# Patient Record
Sex: Female | Born: 1985 | Race: Asian | Hispanic: No | Marital: Married | Smoking: Never smoker
Health system: Southern US, Community
[De-identification: ages and names within clinical notes are randomized; demographics above are authoritative.]

## PROBLEM LIST (undated history)

## (undated) DIAGNOSIS — D649 Anemia, unspecified: Secondary | ICD-10-CM

## (undated) DIAGNOSIS — E079 Disorder of thyroid, unspecified: Secondary | ICD-10-CM

## (undated) HISTORY — DX: Disorder of thyroid, unspecified: E07.9

## (undated) HISTORY — DX: Anemia, unspecified: D64.9

---

## 2016-06-28 ENCOUNTER — Encounter: Payer: Self-pay | Admitting: Family Medicine

## 2016-06-28 ENCOUNTER — Ambulatory Visit (INDEPENDENT_AMBULATORY_CARE_PROVIDER_SITE_OTHER): Payer: 59 | Admitting: Family Medicine

## 2016-06-28 VITALS — BP 120/70 | HR 88 | Temp 98.4°F | Resp 12 | Ht 62.0 in | Wt 245.0 lb

## 2016-06-28 DIAGNOSIS — Z6841 Body Mass Index (BMI) 40.0 and over, adult: Secondary | ICD-10-CM

## 2016-06-28 DIAGNOSIS — D649 Anemia, unspecified: Secondary | ICD-10-CM | POA: Diagnosis not present

## 2016-06-28 DIAGNOSIS — E039 Hypothyroidism, unspecified: Secondary | ICD-10-CM | POA: Diagnosis not present

## 2016-06-28 DIAGNOSIS — R5382 Chronic fatigue, unspecified: Secondary | ICD-10-CM

## 2016-06-28 LAB — BASIC METABOLIC PANEL
BUN: 5 mg/dL — AB (ref 6–23)
CALCIUM: 8.8 mg/dL (ref 8.4–10.5)
CO2: 26 meq/L (ref 19–32)
Chloride: 109 mEq/L (ref 96–112)
Creatinine, Ser: 0.58 mg/dL (ref 0.40–1.20)
GFR: 128.06 mL/min (ref >60.00)
GLUCOSE: 85 mg/dL (ref 70–99)
POTASSIUM: 4.4 meq/L (ref 3.5–5.1)
Sodium: 141 mEq/L (ref 135–145)

## 2016-06-28 LAB — CBC WITH DIFFERENTIAL/PLATELET
Basophils Absolute: 0 10*3/uL (ref 0.0–0.1)
Basophils Relative: 0.4 % (ref 0.0–3.0)
EOS PCT: 2.4 % (ref 0.0–5.0)
Eosinophils Absolute: 0.2 10*3/uL (ref 0.0–0.7)
HEMATOCRIT: 29 % — AB (ref 36.0–46.0)
Hemoglobin: 8.8 g/dL — ABNORMAL LOW (ref 12.0–15.0)
LYMPHS ABS: 2 10*3/uL (ref 0.7–4.0)
Lymphocytes Relative: 32.3 % (ref 12.0–46.0)
MCHC: 30.5 g/dL (ref 30.0–36.0)
MCV: 62.5 fl — AB (ref 78.0–100.0)
MONOS PCT: 5.8 % (ref 3.0–12.0)
Monocytes Absolute: 0.4 10*3/uL (ref 0.1–1.0)
NEUTROS ABS: 3.7 10*3/uL (ref 1.4–7.7)
NEUTROS PCT: 59.1 % (ref 43.0–77.0)
PLATELETS: 313 10*3/uL (ref 150.0–400.0)
RBC: 4.64 Mil/uL (ref 3.87–5.11)
RDW: 21 % — ABNORMAL HIGH (ref 11.5–15.5)
WBC: 6.3 10*3/uL (ref 4.0–10.5)

## 2016-06-28 LAB — VITAMIN B12: Vitamin B-12: 208 pg/mL — ABNORMAL LOW (ref 211–911)

## 2016-06-28 LAB — VITAMIN D 25 HYDROXY (VIT D DEFICIENCY, FRACTURES): VITD: 17.87 ng/mL — ABNORMAL LOW (ref 30.00–100.00)

## 2016-06-28 LAB — TSH: TSH: 11.04 u[IU]/mL — AB (ref 0.35–4.50)

## 2016-06-28 LAB — LIPID PANEL
CHOL/HDL RATIO: 3
Cholesterol: 106 mg/dL (ref 0–200)
HDL: 34 mg/dL — ABNORMAL LOW (ref >39.00)
LDL Cholesterol: 59 mg/dL (ref 0–99)
NONHDL: 71.72
Triglycerides: 63 mg/dL (ref 0.0–149.0)
VLDL: 12.6 mg/dL (ref 0.0–40.0)

## 2016-06-28 LAB — T4, FREE: Free T4: 0.55 ng/dL — ABNORMAL LOW (ref 0.60–1.60)

## 2016-06-28 MED ORDER — LEVOTHYROXINE SODIUM 50 MCG PO TABS: 50.0000 ug | ORAL_TABLET | Freq: Every day | ORAL | Status: DC

## 2016-06-28 NOTE — Progress Notes (Signed)
Pre visit review using our clinic review tool, if applicable. No additional management support is needed unless otherwise documented below in the visit note. 

## 2016-06-28 NOTE — Patient Instructions (Signed)
A few things to remember from today's visit:   1. Chronic fatigue  - Vitamin B12 - VITAMIN D 25 Hydroxy (Vit-D Deficiency, Fractures) - Basic metabolic panel  2. Primary hypothyroidism  - TSH - T4, free - levothyroxine (SYNTHROID, LEVOTHROID) 50 MCG tablet; Take 1 tablet (50 mcg total) by mouth daily.  Dispense: 90 tablet; Refill: 0  3. Anemia, unspecified  - CBC with Differential/Platelet  4. BMI 40.0-44.9, adult (HCC)  - Lipid panel   What are some tips for weight loss? People become overweight for many reasons. Weight issues can run in families. They can be caused by unhealthy behaviors and a person's environment. Certain health problems and medicines can also lead to weight gain. There are some simple things you can do to reach and maintain a healthy weight:  Eat 500 fewer calories per day than your body needs to maintain your weight. Women should aim for no more than 1,200 to 1,500 calories per day. Men should aim for 1,500 to 1,800 calories per day. Avoid sweet drinks. These include regular soft drinks, fruit juices, fruit drinks, energy drinks, sweetened iced tea, and flavored milk. Avoid fast foods. Fast foods such as french fries, hamburgers, chicken nuggets, and pizza are high in calories and can cause weight gain. Eat a healthy breakfast. People who skip breakfast tend to weigh more. Don't watch more than two hours of television per day. Chew sugar-free gum between meals to cut down on snacking. Avoid grocery shopping when you're hungry. Pack a healthy lunch instead of eating out to control what and how much you eat. Eat a lot of fruits and vegetables. Aim for about 2 cups of fruit and 2 to 3 cups of vegetables per day. Aim for 150 minutes per week of moderate-intensity exercise (such as brisk walking), or 75 minutes per week of vigorous exercise (such as jogging or running). Be more active. Small changes in physical activity can easily be added to your daily routine.  For example, take the stairs instead of the elevator. Take a walk with your family. A daily walk is a great way to get exercise and to catch up on the day's events.    We have ordered labs or studies at this visit.  It can take up to 1-2 weeks for results and processing. IF results require follow up or explanation, we will call you with instructions. Clinically stable results will be released to your [facility]. If you have not heard from us or cannot find your results in [facility] in 2 weeks please contact our office at [phone].  If you are not yet signed up for [facility], please consider signing up    If you sign-up for My chart, you can communicate easier with us in case you have any question or concern.

## 2016-06-28 NOTE — Progress Notes (Signed)
HPI:   Christine Barrera is a 30 y.o. female, who is here today to establish care with me.  She moved from [place] in 11/2015.   [place] preventive routine [place]: [place].   Concerns today: Fatigue, which she has had for about 2 years but worse for the past 2-3 weeks.  Reports Hx of anemia, unknown etiology, states that she stop Fe Sulfate about a year ago because numbers were improving. Currently she tries to eat rich iron diet.  She denies heavy menses, gross hematuria, or blood in stool.  No pica.  LMP 06/03/16. Denies depression or anxiety. + Stress, in 12/2015 family left to [place] because issues with their visas.  She sleeps about 6-7 hours.  No loud snoring or sleep apnea.   Hx of hypothyroidism, she stopped Levothyroxine about 2 weeks ago, not sure about dose, lost bottle but she thinks it was 50 mcg.  She does not exercise regularly because schedule with her job but also because she has achy muscles for 2-3 days after she does exercise. In general she eats healthy, she cooks, no portion control.    Review of Systems  Constitutional: Positive for fatigue. Negative for fever, activity change, appetite change and unexpected weight change.  HENT: Negative for mouth sores, nosebleeds and trouble swallowing.   Eyes: Negative for redness and visual disturbance.  Respiratory: Negative for cough, shortness of breath and wheezing.   Cardiovascular: Negative for chest pain, palpitations and leg swelling.  Gastrointestinal: Negative for nausea, vomiting and abdominal pain.       Negative for changes in bowel habits.  Endocrine: Negative for cold intolerance, heat intolerance, polydipsia and polyphagia.  Genitourinary: Negative for dysuria, hematuria, decreased urine volume, difficulty urinating and menstrual problem.  Skin: Negative for color change and rash.  Neurological: Negative for seizures, syncope, weakness, numbness and headaches.  Hematological:  Negative for adenopathy. Does not bruise/bleed easily.  Psychiatric/Behavioral: Negative for confusion and sleep disturbance. The patient is not nervous/anxious.       No current outpatient prescriptions on file prior to visit.   No current facility-administered medications on file prior to visit.     Past Medical History  Diagnosis Date  . Thyroid disease   . Anemia    Allergies  Allergen Reactions  . Ibuprofen Swelling    History reviewed. No pertinent family history.  Social History   Social History  . Marital Status: Unknown    Spouse Name: N/A  . Number of Children: N/A  . Years of Education: N/A   Social History Main Topics  . Smoking status: Never Smoker   . Smokeless tobacco: None  . Alcohol Use: No  . Drug Use: None  . Sexual Activity: Not Currently    Birth Control/ Protection: Condom   Other Topics Concern  . None   Social History Narrative    Filed Vitals:   06/28/16 0819  BP: 120/70  Pulse: 88  Temp: 98.4 F (36.9 C)  Resp: 12    Body mass index is 44.8 kg/(m^2).  SpO2 Readings from Last 3 Encounters:  06/28/16 99%       Physical Exam  Nursing note and vitals reviewed. Constitutional: She is oriented to person, place, and time. She appears well-developed. No distress.  HENT:  Head: Atraumatic.  Mouth/Throat: Oropharynx is clear and moist and mucous membranes are normal.  Eyes: Conjunctivae and EOM are normal. Pupils are equal, round, and reactive to light.  Neck: No tracheal deviation  present. No thyroid mass and no thyromegaly present.  Cardiovascular: Normal rate and regular rhythm.   No murmur heard. Pulses:      Dorsalis pedis pulses are 2+ on the right side, and 2+ on the left side.  Respiratory: Effort normal and breath sounds normal. No respiratory distress.  GI: Soft. She exhibits no mass. There is no tenderness.  Musculoskeletal: She exhibits no edema.  Lymphadenopathy:    She has no cervical adenopathy.    Neurological: She is alert and oriented to person, place, and time. She has normal strength. Coordination normal.  Skin: Skin is warm. No erythema.  Psychiatric: She has a normal mood and affect.  Well groomed, good eye contact.      ASSESSMENT AND PLAN:     [name] was seen today for establish care.  Diagnoses and all orders for this visit:  Chronic fatigue  We discussed possible causes. Healthy diet and regular physical activity may help. Anemia and thyroid disease may contribute to problem.  -     Vitamin B12 -     VITAMIN D 25 Hydroxy (Vit-D Deficiency, Fractures) -     Basic metabolic panel  Primary hypothyroidism  Resume Levothyroxine 50 mcg daily. Will follow labs done today and will give further recommendations accordingly.  -     TSH -     T4, free -     levothyroxine (SYNTHROID, LEVOTHROID) 50 MCG tablet; Take 1 tablet (50 mcg total) by mouth daily.  Anemia, unspecified  No changes in current management, will follow labs done today and will give further recommendations accordingly.  -     CBC with Differential/Platelet  BMI 40.0-44.9, adult (HCC)  We discussed benefits of wt loss as well as adverse effects of obesity. Consistency with healthy diet and physical activity recommended. Weight Watchers is a good option as well as daily brisk walking for 15-30 min as tolerated.  -     Lipid panel      Follow up in 6 months, before if needed or if labs are abnormal.          Mirenda Baltazar G. [place], MD  [facility]. [place] [facility].

## 2016-06-30 ENCOUNTER — Encounter: Payer: Self-pay | Admitting: Family Medicine

## 2016-07-05 ENCOUNTER — Ambulatory Visit (INDEPENDENT_AMBULATORY_CARE_PROVIDER_SITE_OTHER): Payer: 59

## 2016-07-05 DIAGNOSIS — E538 Deficiency of other specified B group vitamins: Secondary | ICD-10-CM

## 2016-07-05 MED ORDER — CYANOCOBALAMIN 1000 MCG/ML IJ SOLN
1000.0000 ug | Freq: Once | INTRAMUSCULAR | Status: AC
Start: 2016-07-05 — End: 2016-07-05
  Administered 2016-07-05: 1000 ug via INTRAMUSCULAR

## 2016-07-12 ENCOUNTER — Ambulatory Visit (INDEPENDENT_AMBULATORY_CARE_PROVIDER_SITE_OTHER): Payer: 59

## 2016-07-12 DIAGNOSIS — E538 Deficiency of other specified B group vitamins: Secondary | ICD-10-CM | POA: Diagnosis not present

## 2016-07-12 MED ORDER — CYANOCOBALAMIN 1000 MCG/ML IJ SOLN
1000.0000 ug | Freq: Once | INTRAMUSCULAR | Status: AC
  Administered 2016-07-12: 1000 ug via INTRAMUSCULAR

## 2016-07-17 ENCOUNTER — Ambulatory Visit: Payer: 59 | Admitting: Family Medicine

## 2016-07-19 ENCOUNTER — Ambulatory Visit (INDEPENDENT_AMBULATORY_CARE_PROVIDER_SITE_OTHER): Payer: 59

## 2016-07-19 DIAGNOSIS — E538 Deficiency of other specified B group vitamins: Secondary | ICD-10-CM

## 2016-07-19 MED ORDER — CYANOCOBALAMIN 1000 MCG/ML IJ SOLN
1000.0000 ug | Freq: Once | INTRAMUSCULAR | Status: AC
  Administered 2016-07-19: 1000 ug via INTRAMUSCULAR

## 2016-07-26 ENCOUNTER — Ambulatory Visit (INDEPENDENT_AMBULATORY_CARE_PROVIDER_SITE_OTHER): Payer: 59

## 2016-07-26 DIAGNOSIS — E538 Deficiency of other specified B group vitamins: Secondary | ICD-10-CM | POA: Diagnosis not present

## 2016-07-26 MED ORDER — CYANOCOBALAMIN 1000 MCG/ML IJ SOLN
1000.0000 ug | Freq: Once | INTRAMUSCULAR | Status: AC
  Administered 2016-07-26: 1000 ug via INTRAMUSCULAR

## 2016-10-12 ENCOUNTER — Other Ambulatory Visit: Payer: Self-pay | Admitting: Family Medicine

## 2016-10-12 DIAGNOSIS — E039 Hypothyroidism, unspecified: Secondary | ICD-10-CM

## 2016-10-20 ENCOUNTER — Ambulatory Visit: Payer: 59 | Admitting: Family Medicine

## 2016-10-23 ENCOUNTER — Ambulatory Visit (INDEPENDENT_AMBULATORY_CARE_PROVIDER_SITE_OTHER): Payer: 59 | Admitting: Family Medicine

## 2016-10-23 ENCOUNTER — Encounter: Payer: Self-pay | Admitting: Family Medicine

## 2016-10-23 VITALS — BP 122/80 | HR 96 | Temp 98.2°F | Resp 12 | Ht 62.0 in | Wt 243.4 lb

## 2016-10-23 DIAGNOSIS — M79644 Pain in right finger(s): Secondary | ICD-10-CM | POA: Diagnosis not present

## 2016-10-23 DIAGNOSIS — Z23 Encounter for immunization: Secondary | ICD-10-CM | POA: Diagnosis not present

## 2016-10-23 DIAGNOSIS — S60459S Superficial foreign body of unspecified finger, sequela: Secondary | ICD-10-CM | POA: Diagnosis not present

## 2016-10-23 DIAGNOSIS — S63614A Unspecified sprain of right ring finger, initial encounter: Secondary | ICD-10-CM

## 2016-10-23 NOTE — Progress Notes (Signed)
HPI:  ACUTE VISIT:  Chief Complaint  Patient presents with  . right hand pain    only really notices when doing hard work     [name] is a 30 y.o. female, who is here today complaining of right 4th finger pain that started about 2 weeks ago.  According to patient, pain started about 2 weeks ago when she was doing repetitive heavy lifting, she was moving cardboard boxes. No limitation of movement, so she completed task. Pulling like pain, severe when it first started but better now, she cannot give me a number between 1 to 10, states that is is not as severe as it was. It is exacerbated by frequent heavy lifting, pain usually starts a few minutes into activity and resolves with rest. She denies any pain with palpation, limitation of ROM, local edema, erythema, skin rash, numbness, tingling, of weakness. Denies cervical pain, odynophagia, dysphagia, dyspnea, fever, or chills.  Pain is localized on 4 right fingertip and radiates down along finger and to palm. She has felt some pain on forearm, mild and no frequent.  She is reporting that about 2 months ago she felt a small splinter on same finger tip and she is concerned that this could be related to her pain; She is not sure if she was able to remove it.  Right handed.  She does not recall when her last Tdap was.  She has taken OTC Tylenol.  Communication is difficult, difficulty with English, hard to obtain details.    Review of Systems  Constitutional: Negative for appetite change, chills, fatigue and fever.  HENT: Negative for mouth sores, trouble swallowing and voice change.   Respiratory: Negative for cough, shortness of breath and wheezing.   Cardiovascular: Negative for leg swelling.  Musculoskeletal: Positive for arthralgias. Negative for joint swelling and neck pain.  Skin: Negative for rash and wound.  Neurological: Negative for weakness, numbness and headaches.  Hematological: Negative for  adenopathy. Does not bruise/bleed easily.  Psychiatric/Behavioral: Negative for confusion. The patient is nervous/anxious.       Current Outpatient Prescriptions on File Prior to Visit  Medication Sig Dispense Refill  . levothyroxine (SYNTHROID, LEVOTHROID) 50 MCG tablet TAKE 1 TABLET EVERY DAY 90 tablet 0   No current facility-administered medications on file prior to visit.      Past Medical History:  Diagnosis Date  . Anemia   . Thyroid disease    Allergies  Allergen Reactions  . Ibuprofen Swelling    Social History   Social History  . Marital status: Unknown    Spouse name: N/A  . Number of children: 1  . Years of education: N/A   Social History Main Topics  . Smoking status: Never Smoker  . Smokeless tobacco: None  . Alcohol use No  . Drug use: Unknown  . Sexual activity: Not Currently    Birth control/ protection: Condom   Other Topics Concern  . None   Social History Narrative  . None    Vitals:   10/23/16 1537  BP: 122/80  Pulse: 96  Resp: 12  Temp: 98.2 F (36.8 C)   Body mass index is 44.51 kg/m.   Physical Exam  Nursing note and vitals reviewed. Constitutional: She appears well-developed. She does not appear ill. No distress.  HENT:  Head: Atraumatic.  Eyes: Conjunctivae are normal.  Cardiovascular:  Pulses:      Radial pulses are 2+ on the right side.  Musculoskeletal: She exhibits no  edema or tenderness.       Right wrist: She exhibits normal range of motion, no tenderness, no swelling and no deformity.       Right hand: She exhibits normal range of motion, no tenderness, no bony tenderness, normal capillary refill, no deformity and no swelling. Normal sensation noted. Normal strength noted.  Skin: Skin is warm. No ecchymosis and no rash noted. No cyanosis or erythema.  Psychiatric: Her mood appears anxious.  Well groomed, good eye contact.      ASSESSMENT AND PLAN:     [name] was seen today for right hand  pain.  Diagnoses and all orders for this visit:  Finger pain, right -     DG Finger Ring Right; Future -     DG Hand 2 View Right; Future  Sprain of right ring finger, unspecified site of finger, initial encounter -     DG Finger Ring Right; Future -     DG Hand 2 View Right; Future  Superficial foreign body of finger, sequela  Need for Tdap vaccination -     Tdap vaccine greater than or equal to 7yo IM   We discussed possible causes of pain, examination today is otherwise negative. I think it is related with soft tissue injury, sprain of 4th finger. She would like to have an X ray done, explained it will not show soft tissue.  -I don't appreciate foreign body on fingertip, explained that it will be hard to remove splinter after 2 months if in fact foreign body is still there.  Tdap given since she does not recall last one. Avoid activities that aggravate pain. Monitor for now. Since she is allergic to NSAID's, continue Tylenol if needed.    OV face to face from 3:40 pm to 4:11 pm. > 50% of visit was dedicated to reassurance, possible causes of pain, treatment options, and plan of care if case of pain does not resolved.   Return if symptoms worsen or fail to improve.     -Ms.Christine FassSaraswathi Scheper was advised to return or notify a doctor immediately if symptoms worsen or persist or new concerns arise.       Joffre Lucks G. [place], MD  [facility]. [place] [facility].

## 2016-10-23 NOTE — Progress Notes (Signed)
Pre visit review using our clinic review tool, if applicable. No additional management support is needed unless otherwise documented below in the visit note. 

## 2016-10-23 NOTE — Patient Instructions (Signed)
A few things to remember from today's visit:   Finger pain, right - Plan: DG Finger Ring Right, DG Hand 2 View Right  Sprain of right ring finger, unspecified site of finger, initial encounter - Plan: DG Finger Ring Right, DG Hand 2 View Right  Need for Tdap vaccination - Plan: Tdap vaccine greater than or equal to 32yo IM  Monitor for now.  Since you are allergic to Ibuprofen I do not recommend other treatment different to Tylenol. Avoid activity that aggravate pain  Please be sure medication list is accurate. If a new problem present, please set up appointment sooner than planned today.

## 2017-01-15 ENCOUNTER — Other Ambulatory Visit: Payer: Self-pay | Admitting: Family Medicine

## 2017-01-15 DIAGNOSIS — E039 Hypothyroidism, unspecified: Secondary | ICD-10-CM

## 2017-04-21 ENCOUNTER — Other Ambulatory Visit: Payer: Self-pay | Admitting: Family Medicine

## 2017-04-21 DIAGNOSIS — E039 Hypothyroidism, unspecified: Secondary | ICD-10-CM

## 2017-07-11 DIAGNOSIS — K5900 Constipation, unspecified: Secondary | ICD-10-CM | POA: Diagnosis not present

## 2017-09-13 ENCOUNTER — Encounter: Payer: Self-pay | Admitting: Family Medicine

## 2017-11-23 ENCOUNTER — Other Ambulatory Visit: Payer: Self-pay | Admitting: Family Medicine

## 2017-11-23 DIAGNOSIS — E039 Hypothyroidism, unspecified: Secondary | ICD-10-CM

## 2018-01-31 DIAGNOSIS — H35412 Lattice degeneration of retina, left eye: Secondary | ICD-10-CM | POA: Diagnosis not present

## 2018-03-07 ENCOUNTER — Other Ambulatory Visit: Payer: Self-pay | Admitting: Family Medicine

## 2018-03-07 DIAGNOSIS — E039 Hypothyroidism, unspecified: Secondary | ICD-10-CM

## 2018-03-12 ENCOUNTER — Other Ambulatory Visit: Payer: Self-pay | Admitting: Family Medicine

## 2018-03-12 DIAGNOSIS — E039 Hypothyroidism, unspecified: Secondary | ICD-10-CM

## 2018-03-12 NOTE — Telephone Encounter (Signed)
Spoke with patient will be in on 03/18/18 for follow-up and medication refill.

## 2018-03-17 DIAGNOSIS — E559 Vitamin D deficiency, unspecified: Secondary | ICD-10-CM | POA: Insufficient documentation

## 2018-03-17 DIAGNOSIS — D509 Iron deficiency anemia, unspecified: Secondary | ICD-10-CM | POA: Insufficient documentation

## 2018-03-17 DIAGNOSIS — E538 Deficiency of other specified B group vitamins: Secondary | ICD-10-CM | POA: Insufficient documentation

## 2018-03-17 NOTE — Progress Notes (Signed)
HPI:   [name] is a 32 y.o. female, who is here today for follow up on some of her chronic medical problems.  She was last seen on 10/23/16.   Hypothyroidism:  Currently she is on Levothyroxine 50 mcg daily. Tolerating medication well, no side effects reported. She has not noted dysphagia, palpitations, abdominal pain, changes in bowel habits, tremor, cold/heat intolerance, or abnormal weight loss.  Lab Results  Component Value Date   TSH 11.04 (H) 06/28/2016    B12 deficiency: She is on not on B12 supplementation.  She is eating lamb liver 1 or twice per week.  She has no noted numbness, tingling, or fatigue.  She states that if she does not eat lamp liver for 4 weeks she started feeling fatigue. Lab Results  Component Value Date   VITAMINB12 208 (L) 06/28/2016   Vit D deficiency:  Last 67 OH vit D in 06/2016 was low at 17.8.  She is not on vitamin D supplementation.     Anemia:  Lab Results  Component Value Date   WBC 6.3 06/28/2016   HGB 8.8 Repeated and verified X2. (L) 06/28/2016   HCT 29.0 (L) 06/28/2016   MCV 62.5 (L) 06/28/2016   PLT 313.0 06/28/2016    She is not on iron supplementation. She denies heavy menstrual periods. She is reporting history of anemia for many years. She denies Pica, exertional chest pain, dyspnea, palpitation, or dizziness  LMP: 2 weeks ago.    Review of Systems  Constitutional: Negative for activity change, appetite change, fatigue, fever and unexpected weight change.  HENT: Negative for mouth sores, nosebleeds and trouble swallowing.   Eyes: Negative for redness and visual disturbance.  Respiratory: Negative for cough, shortness of breath and wheezing.   Cardiovascular: Negative for chest pain, palpitations and leg swelling.  Gastrointestinal: Negative for abdominal pain, nausea and vomiting.       Negative for changes in bowel habits.  Endocrine: Negative for cold intolerance and heat  intolerance.  Genitourinary: Negative for decreased urine volume, dysuria and hematuria.  Skin: Negative for pallor and rash.  Neurological: Negative for syncope, weakness and headaches.  Hematological: Negative for adenopathy. Does not bruise/bleed easily.      Current Outpatient Medications on File Prior to Visit  Medication Sig Dispense Refill  . levothyroxine (SYNTHROID, LEVOTHROID) 50 MCG tablet TAKE 1 TABLET BY MOUTH EVERY DAY 90 tablet 0   No current facility-administered medications on file prior to visit.      Past Medical History:  Diagnosis Date  . Anemia   . Thyroid disease    Allergies  Allergen Reactions  . Ibuprofen Swelling    Social History   Socioeconomic History  . Marital status: Unknown    Spouse name: Not on file  . Number of children: 1  . Years of education: Not on file  . Highest education level: Not on file  Occupational History  . Not on file  Social Needs  . Financial resource strain: Not on file  . Food insecurity:    Worry: Not on file    Inability: Not on file  . Transportation needs:    Medical: Not on file    Non-medical: Not on file  Tobacco Use  . Smoking status: Never Smoker  . Smokeless tobacco: Never Used  Substance and Sexual Activity  . Alcohol use: No    Alcohol/week: 0.0 oz  . Drug use: Never  . Sexual activity: Not Currently  Birth control/protection: Condom  Lifestyle  . Physical activity:    Days per week: Not on file    Minutes per session: Not on file  . Stress: Not on file  Relationships  . Social connections:    Talks on phone: Not on file    Gets together: Not on file    Attends religious service: Not on file    Active member of club or organization: Not on file    Attends meetings of clubs or organizations: Not on file    Relationship status: Not on file  Other Topics Concern  . Not on file  Social History Narrative  . Not on file    Vitals:   03/18/18 0944  BP: 123/77  Pulse: 97  Resp:  12  Temp: 98.8 F (37.1 C)  SpO2: 98%   Body mass index is 40.31 kg/m.   Physical Exam  Nursing note and vitals reviewed. Constitutional: She is oriented to person, place, and time. She appears well-developed. No distress.  HENT:  Head: Normocephalic and atraumatic.  Mouth/Throat: Oropharynx is clear and moist and mucous membranes are normal.  Eyes: Pupils are equal, round, and reactive to light. Conjunctivae are normal.  Neck: No tracheal deviation present. No thyroid mass and no thyromegaly (Palpable.) present.  Cardiovascular: Normal rate and regular rhythm.  No murmur heard. Pulses:      Dorsalis pedis pulses are 2+ on the right side, and 2+ on the left side.  Respiratory: Effort normal and breath sounds normal. No respiratory distress.  GI: Soft. She exhibits no mass. There is no hepatomegaly. There is no tenderness.  Musculoskeletal: She exhibits no edema or tenderness.  Lymphadenopathy:    She has no cervical adenopathy.  Neurological: She is alert and oriented to person, place, and time. She has normal strength. Coordination normal.  Skin: Skin is warm. No erythema. There is pallor.  Psychiatric: She has a normal mood and affect.  Well groomed, good eye contact.      ASSESSMENT AND PLAN:   [name] was seen today for follow-up.  Orders Placed This Encounter  Procedures  . Lipid panel  . Vitamin B12  . VITAMIN D 25 Hydroxy (Vit-D Deficiency, Fractures)  . CBC with Differential/Platelet  . TSH  . Basic metabolic panel  . IBC panel   Lab Results  Component Value Date   WBC 6.5 03/18/2018   HGB 9.6 (L) 03/18/2018   HCT 32.2 (L) 03/18/2018   MCV 63.2 Repeated and verified X2. (L) 03/18/2018   PLT 380.0 03/18/2018   Lab Results  Component Value Date   VITAMINB12 205 (L) 03/18/2018    Lab Results  Component Value Date   CHOL 109 03/18/2018   HDL 37.60 (L) 03/18/2018   LDLCALC 59 03/18/2018   TRIG 61.0 03/18/2018   CHOLHDL 3 03/18/2018    Lab Results  Component Value Date   TSH 0.28 (L) 03/18/2018    Lab Results  Component Value Date   CREATININE 0.52 03/18/2018   BUN 7 03/18/2018   NA 138 03/18/2018   K 4.6 03/18/2018   CL 107 03/18/2018   CO2 28 03/18/2018     Iron deficiency anemia Asymptomatic. Further recommendations will be given according to CBC results.  Primary hypothyroidism No changes in current management, will follow labs done today and will give further recommendations accordingly. Follow-up in 6 months to 1 year.  B12 deficiency I will make recommendations based on B12 results.   Vitamin D deficiency,  unspecified  We will follow labs done today and will give further recommendations accordingly.  Screening for lipid disorders  Low fat diet recommended. Further recommendations will be given according to labs results.     -[name] was advised to return sooner than planned today if new concerns arise.       Rivan Siordia G. [place], MD  [facility]. [place] office.

## 2018-03-18 ENCOUNTER — Ambulatory Visit (INDEPENDENT_AMBULATORY_CARE_PROVIDER_SITE_OTHER): Payer: 59 | Admitting: Family Medicine

## 2018-03-18 ENCOUNTER — Encounter: Payer: Self-pay | Admitting: Family Medicine

## 2018-03-18 VITALS — BP 123/77 | HR 97 | Temp 98.8°F | Resp 12 | Ht 62.0 in | Wt 220.4 lb

## 2018-03-18 DIAGNOSIS — D509 Iron deficiency anemia, unspecified: Secondary | ICD-10-CM

## 2018-03-18 DIAGNOSIS — Z13220 Encounter for screening for lipoid disorders: Secondary | ICD-10-CM

## 2018-03-18 DIAGNOSIS — E039 Hypothyroidism, unspecified: Secondary | ICD-10-CM

## 2018-03-18 DIAGNOSIS — E559 Vitamin D deficiency, unspecified: Secondary | ICD-10-CM | POA: Diagnosis not present

## 2018-03-18 DIAGNOSIS — E538 Deficiency of other specified B group vitamins: Secondary | ICD-10-CM | POA: Diagnosis not present

## 2018-03-18 LAB — VITAMIN D 25 HYDROXY (VIT D DEFICIENCY, FRACTURES): VITD: 5.46 ng/mL — ABNORMAL LOW (ref 30.00–100.00)

## 2018-03-18 LAB — LIPID PANEL
CHOLESTEROL: 109 mg/dL (ref 0–200)
HDL: 37.6 mg/dL — AB (ref >39.00)
LDL CALC: 59 mg/dL (ref 0–99)
NonHDL: 71.47
TRIGLYCERIDES: 61 mg/dL (ref 0.0–149.0)
Total CHOL/HDL Ratio: 3
VLDL: 12.2 mg/dL (ref 0.0–40.0)

## 2018-03-18 LAB — BASIC METABOLIC PANEL
BUN: 7 mg/dL (ref 6–23)
CALCIUM: 9.2 mg/dL (ref 8.4–10.5)
CHLORIDE: 107 meq/L (ref 96–112)
CO2: 28 mEq/L (ref 19–32)
CREATININE: 0.52 mg/dL (ref 0.40–1.20)
GFR: 143.72 mL/min (ref >60.00)
Glucose, Bld: 99 mg/dL (ref 70–99)
Potassium: 4.6 mEq/L (ref 3.5–5.1)
Sodium: 138 mEq/L (ref 135–145)

## 2018-03-18 LAB — CBC WITH DIFFERENTIAL/PLATELET
BASOS PCT: 0.3 % (ref 0.0–3.0)
Basophils Absolute: 0 10*3/uL (ref 0.0–0.1)
EOS PCT: 1.8 % (ref 0.0–5.0)
Eosinophils Absolute: 0.1 10*3/uL (ref 0.0–0.7)
HCT: 32.2 % — ABNORMAL LOW (ref 36.0–46.0)
Hemoglobin: 9.6 g/dL — ABNORMAL LOW (ref 12.0–15.0)
LYMPHS ABS: 2.1 10*3/uL (ref 0.7–4.0)
Lymphocytes Relative: 31.8 % (ref 12.0–46.0)
MCHC: 29.9 g/dL — AB (ref 30.0–36.0)
MCV: 63.2 fl — ABNORMAL LOW (ref 78.0–100.0)
MONO ABS: 0.4 10*3/uL (ref 0.1–1.0)
Monocytes Relative: 6.1 % (ref 3.0–12.0)
NEUTROS ABS: 3.9 10*3/uL (ref 1.4–7.7)
NEUTROS PCT: 60 % (ref 43.0–77.0)
PLATELETS: 380 10*3/uL (ref 150.0–400.0)
RBC: 5.09 Mil/uL (ref 3.87–5.11)
RDW: 19.9 % — AB (ref 11.5–15.5)
WBC: 6.5 10*3/uL (ref 4.0–10.5)

## 2018-03-18 LAB — IBC PANEL
IRON: 16 ug/dL — AB (ref 42–145)
Saturation Ratios: 3.1 % — ABNORMAL LOW (ref 20.0–50.0)
Transferrin: 369 mg/dL — ABNORMAL HIGH (ref 212.0–360.0)

## 2018-03-18 LAB — VITAMIN B12: Vitamin B-12: 205 pg/mL — ABNORMAL LOW (ref 211–911)

## 2018-03-18 LAB — TSH: TSH: 0.28 u[IU]/mL — AB (ref 0.35–4.50)

## 2018-03-18 NOTE — Assessment & Plan Note (Signed)
Asymptomatic. Further recommendations will be given according to CBC results.

## 2018-03-18 NOTE — Patient Instructions (Addendum)
A few things to remember from today's visit:   Primary hypothyroidism - Plan: TSH, Basic metabolic panel  Iron deficiency anemia, unspecified iron deficiency anemia type - Plan: CBC with Differential/Platelet, IBC panel  B12 deficiency - Plan: Vitamin B12, CBC with Differential/Platelet  Vitamin D deficiency, unspecified - Plan: VITAMIN D 25 Hydroxy (Vit-D Deficiency, Fractures), Basic metabolic panel  Screening for lipid disorders - Plan: Lipid panel  Recommendations to be given depending of lab result.   Please be sure medication list is accurate. If a new problem present, please set up appointment sooner than planned today.

## 2018-03-18 NOTE — Assessment & Plan Note (Signed)
No changes in current management, will follow labs done today and will give further recommendations accordingly. Follow-up in 6 months to 1 year.

## 2018-03-18 NOTE — Assessment & Plan Note (Signed)
I will make recommendations based on B12 results.

## 2018-03-21 ENCOUNTER — Telehealth: Payer: Self-pay | Admitting: Family Medicine

## 2018-03-21 NOTE — Telephone Encounter (Signed)
Patient informed that Dr. Sw[place]Jordan hasn't looked at labs and when she does and make recommendations that I would be calling her back with results. Patient verbalized understanding.

## 2018-03-21 NOTE — Telephone Encounter (Signed)
Copied from CRM [phone]. Topic: Quick Communication - See Telephone Encounter >> Mar 21, 2018 12:20 PM Diana EvesHoyt, Maryann B wrote: CRM for notification. See Telephone encounter for: 03/21/18. Pt calling about labs done on 03/18/18.

## 2018-03-22 ENCOUNTER — Encounter: Payer: Self-pay | Admitting: Family Medicine

## 2018-03-22 MED ORDER — FERROUS SULFATE 325 (65 FE) MG PO TABS: 325.0000 mg | ORAL_TABLET | Freq: Every day | ORAL | 3 refills | Status: DC

## 2018-03-22 MED ORDER — LEVOTHYROXINE SODIUM 25 MCG PO CAPS: 25.0000 ug | ORAL_CAPSULE | Freq: Every day | ORAL | 0 refills | Status: DC

## 2018-03-25 ENCOUNTER — Other Ambulatory Visit: Payer: Self-pay | Admitting: Family Medicine

## 2018-03-25 MED ORDER — VITAMIN D (ERGOCALCIFEROL) 1.25 MG (50000 UNIT) PO CAPS: 50000.0000 [IU] | ORAL_CAPSULE | ORAL | 0 refills | Status: AC

## 2018-05-19 ENCOUNTER — Other Ambulatory Visit: Payer: Self-pay | Admitting: Family Medicine

## 2018-06-23 ENCOUNTER — Other Ambulatory Visit: Payer: Self-pay | Admitting: Family Medicine

## 2018-06-23 DIAGNOSIS — E039 Hypothyroidism, unspecified: Secondary | ICD-10-CM

## 2018-07-21 DIAGNOSIS — K591 Functional diarrhea: Secondary | ICD-10-CM | POA: Diagnosis not present

## 2018-07-21 DIAGNOSIS — R110 Nausea: Secondary | ICD-10-CM | POA: Diagnosis not present

## 2018-09-16 ENCOUNTER — Ambulatory Visit: Payer: Self-pay

## 2018-09-16 NOTE — Telephone Encounter (Signed)
Pt. called to report intermittent chest pain in left breast area x 2 weeks.  Reported the pain lasts one minute or less.  Stated the left breast pain radiates to the left shoulder at times.  Stated she is not having the pain at the present time.  Denied shortness of breath, dizziness, sweating, nausea, or vomiting associated with the pain.  Denied cough or pain upon taking deep breath.  Described the pain as feeling like a "pinching" sensation.  Rated the pain as "mild" @ 1-3/10.   Stated she thought the pain may be related to a "gastric problem". Is not able to correlate the pain to meals.  Stated the pain has increased in frequency over past 2 weeks, and she just wanted to get it checked out.  Advised of appt. avail. today at 4:45 PM.  Declined today's appt.  Also, declined an appt. tomorrow.  Stated she preferred an appt. on Wednesday.  Due to mild intensity and short duration of the pain with no associated symptoms, and of low risk factors, gave an appt. at 9:30 AM on 9/25.  Encouraged pt. to call back, or go to ER if symptoms worsen; ie:  shortness of breath, or increased frequency, duration, or intensity of the pain. Verb. Understanding and agreed with plan.      Reason for Disposition . [1] Chest pain lasting <= 5 minutes AND [2] NO chest pain or cardiac symptoms now(Exceptions: pains lasting a few seconds)  Answer Assessment - Initial Assessment Questions 1. LOCATION: "Where does it hurt?"       Denied chest pain at this time; has left breast pain at intervals 2. RADIATION: "Does the pain go anywhere else?" (e.g., into neck, jaw, arms, back)     Sometimes it goes up into left shoulder    3. ONSET: "When did the chest pain begin?" (Minutes, hours or days)      X 2 weeks 4. PATTERN "Does the pain come and go, or has it been constant since it started?"  "Does it get worse with exertion?"      Comes and goes  5. DURATION: "How long does it last" (e.g., seconds, minutes, hours)     Lasts about a  minute but no longer 6. SEVERITY: "How bad is the pain?"  (e.g., Scale 1-10; mild, moderate, or severe)    - MILD (1-3): doesn't interfere with normal activities     - MODERATE (4-7): interferes with normal activities or awakens from sleep    - SEVERE (8-10): excruciating pain, unable to do any normal activities       Mild 1-3/10; describes as a pinching pain  7. CARDIAC RISK FACTORS: "Do you have any history of heart problems or risk factors for heart disease?" (e.g., prior heart attack, angina; high blood pressure, diabetes, being overweight, high cholesterol, smoking, or strong family history of heart disease)    Denies diabetes, high blood pressure, stated her cholesterol level is "moderate"; admits to being overweight; denied family hx.   8. PULMONARY RISK FACTORS: "Do you have any history of lung disease?"  (e.g., blood clots in lung, asthma, emphysema, birth control pills)     Denied chr. Hx with lungs; denied pain with deep breath, denied taking BCP 9. CAUSE: "What do you think is causing the chest pain?"     Thinking maybe a gastric problem; unsure if associated with meals.  10. OTHER SYMPTOMS: "Do you have any other symptoms?" (e.g., dizziness, nausea, vomiting, sweating, fever, difficulty breathing, cough)  Denied dizziness, nausea or vomiting, sweating or shortness of breath ; denied cough or URI  11. PREGNANCY: "Is there any chance you are pregnant?" "When was your last menstrual period?"       No; LMP was last week  Protocols used: CHEST PAIN-A-AH

## 2018-09-18 ENCOUNTER — Ambulatory Visit (INDEPENDENT_AMBULATORY_CARE_PROVIDER_SITE_OTHER): Payer: 59 | Admitting: Family Medicine

## 2018-09-18 ENCOUNTER — Encounter: Payer: Self-pay | Admitting: Family Medicine

## 2018-09-18 VITALS — BP 118/60 | HR 87 | Temp 98.9°F | Resp 12 | Ht 62.0 in | Wt 227.1 lb

## 2018-09-18 DIAGNOSIS — R079 Chest pain, unspecified: Secondary | ICD-10-CM | POA: Diagnosis not present

## 2018-09-18 DIAGNOSIS — M25511 Pain in right shoulder: Secondary | ICD-10-CM

## 2018-09-18 DIAGNOSIS — E039 Hypothyroidism, unspecified: Secondary | ICD-10-CM | POA: Diagnosis not present

## 2018-09-18 DIAGNOSIS — M940 Chondrocostal junction syndrome [Tietze]: Secondary | ICD-10-CM

## 2018-09-18 DIAGNOSIS — M25512 Pain in left shoulder: Secondary | ICD-10-CM

## 2018-09-18 LAB — LIPID PANEL
CHOL/HDL RATIO: 3
Cholesterol: 122 mg/dL (ref 0–200)
HDL: 36.8 mg/dL — AB (ref >39.00)
LDL Cholesterol: 72 mg/dL (ref 0–99)
NONHDL: 84.78
Triglycerides: 66 mg/dL (ref 0.0–149.0)
VLDL: 13.2 mg/dL (ref 0.0–40.0)

## 2018-09-18 LAB — TSH: TSH: 6.89 u[IU]/mL — ABNORMAL HIGH (ref 0.35–4.50)

## 2018-09-18 MED ORDER — DICLOFENAC SODIUM 1 % TD GEL: 4.0000 g | Freq: Four times a day (QID) | TRANSDERMAL | 3 refills | Status: DC

## 2018-09-18 NOTE — Patient Instructions (Signed)
A few things to remember from today's visit:   Chest pain, unspecified type - Plan: EKG 12-Lead  Primary hypothyroidism - Plan: TSH, Lipid panel  Costochondritis  Bilateral shoulder pain, unspecified chronicity  Costochondritis Costochondritis is swelling and irritation (inflammation) of the tissue (cartilage) that connects your ribs to your breastbone (sternum). This causes pain in the front of your chest. Usually, the pain:  Starts gradually.  Is in more than one rib.  This condition usually goes away on its own over time. Follow these instructions at home:  Do not do anything that makes your pain worse.  If directed, put ice on the painful area: ? Put ice in a plastic bag. ? Place a towel between your skin and the bag. ? Leave the ice on for 20 minutes, 2-3 times a day.  If directed, put heat on the affected area as often as told by your doctor. Use the heat source that your doctor tells you to use, such as a moist heat pack or a heating pad. ? Place a towel between your skin and the heat source. ? Leave the heat on for 20-30 minutes. ? Take off the heat if your skin turns bright red. This is very important if you cannot feel pain, heat, or cold. You may have a greater risk of getting burned.  Take over-the-counter and prescription medicines only as told by your doctor.  Return to your normal activities as told by your doctor. Ask your doctor what activities are safe for you.  Keep all follow-up visits as told by your doctor. This is important. Contact a doctor if:  You have chills or a fever.  Your pain does not go away or it gets worse.  You have a cough that does not go away. Get help right away if:  You are short of breath. This information is not intended to replace advice given to you by your health care provider. Make sure you discuss any questions you have with your health care provider. Document Released: 05/29/2008 Document Revised: 06/30/2016 Document  Reviewed: 04/05/2016 Elsevier Interactive Patient Education  [employer].   Please be sure medication list is accurate. If a new problem present, please set up appointment sooner than planned today.

## 2018-09-18 NOTE — Progress Notes (Signed)
ACUTE VISIT   HPI:  Chief Complaint  Patient presents with  . Chest Pain    off and on for 3 weeks    Christine Barrera is a 32 y.o. female, who is here today complaining of 3 weeks of intermittent left parasternal chest pain,she can pint point affected area. "Crushing pain",no radiated. Pain is exacerbated by deep breathing. No alleviating factors.  Last episode lasted about an hour, 3 days ago.  No Hx of trauma. No associated fever,chills,cough,wheezing,or SOB.  No local rash or edema.  She has not noted heartburn. + Burping,which is not unusual for her.  Left shoulder pain,not associated with chest pain. Intermittent also for 3 weeks. Exacerbated  By movement. No edema or erythema. No limitation of ROM. No trauma.  She has not tried OTC medications.   She is also requesting her cholesterol check.  Hypothyroidism, she is on Levothyroxine 25 mcg daily. Last TSH was abnormal at 0.28, she did not have TSH re-check as instructed.  She denies wt loss,tremor,diarreha,or abnormal wt loss.    Review of Systems  Constitutional: Negative for activity change, appetite change, fatigue and fever.  HENT: Negative for mouth sores, nosebleeds and trouble swallowing.   Respiratory: Negative for cough, shortness of breath and wheezing.   Cardiovascular: Positive for chest pain. Negative for palpitations and leg swelling.  Gastrointestinal: Negative for abdominal pain, nausea and vomiting.       Negative for changes in bowel habits.  Endocrine: Negative for cold intolerance and heat intolerance.  Musculoskeletal: Negative for gait problem and myalgias.  Skin: Negative for color change and rash.  Neurological: Negative for syncope, weakness, numbness and headaches.      Current Outpatient Medications on File Prior to Visit  Medication Sig Dispense Refill  . ferrous sulfate 325 (65 FE) MG tablet Take 1 tablet (325 mg total) by mouth daily. 90 tablet 3  .  levothyroxine (SYNTHROID, LEVOTHROID) 25 MCG tablet TAKE 1 CAPSULE (25 MCG TOTAL) BY MOUTH DAILY BEFORE BREAKFAST. 90 tablet 0  . ondansetron (ZOFRAN) 4 MG tablet Take by mouth.     No current facility-administered medications on file prior to visit.      Past Medical History:  Diagnosis Date  . Anemia   . Thyroid disease    Allergies  Allergen Reactions  . Ibuprofen Swelling    Social History   Socioeconomic History  . Marital status: Unknown    Spouse name: Not on file  . Number of children: 1  . Years of education: Not on file  . Highest education level: Not on file  Occupational History  . Not on file  Social Needs  . Financial resource strain: Not on file  . Food insecurity:    Worry: Not on file    Inability: Not on file  . Transportation needs:    Medical: Not on file    Non-medical: Not on file  Tobacco Use  . Smoking status: Never Smoker  . Smokeless tobacco: Never Used  Substance and Sexual Activity  . Alcohol use: No    Alcohol/week: 0.0 standard drinks  . Drug use: Never  . Sexual activity: Not Currently    Birth control/protection: Condom  Lifestyle  . Physical activity:    Days per week: Not on file    Minutes per session: Not on file  . Stress: Not on file  Relationships  . Social connections:    Talks on phone: Not on file    Gets  together: Not on file    Attends religious service: Not on file    Active member of club or organization: Not on file    Attends meetings of clubs or organizations: Not on file    Relationship status: Not on file  Other Topics Concern  . Not on file  Social History Narrative  . Not on file    Vitals:   09/18/18 0938  BP: 118/60  Pulse: 87  Resp: 12  Temp: 98.9 F (37.2 C)  SpO2: 100%   Body mass index is 41.54 kg/m.   Physical Exam  Nursing note and vitals reviewed. Constitutional: She is oriented to person, place, and time. She appears well-developed. No distress.  HENT:  Head: Normocephalic  and atraumatic.  Mouth/Throat: Oropharynx is clear and moist and mucous membranes are normal.  Eyes: Pupils are equal, round, and reactive to light. Conjunctivae are normal.  Cardiovascular: Normal rate and regular rhythm.  No murmur heard. Pulses:      Dorsalis pedis pulses are 2+ on the right side, and 2+ on the left side.  Respiratory: Effort normal and breath sounds normal. No respiratory distress. She exhibits tenderness.  GI: Soft. She exhibits no mass. There is no hepatomegaly. There is no tenderness.  Musculoskeletal: She exhibits no edema.       Left shoulder: She exhibits normal range of motion, no tenderness, no crepitus and no pain.       Cervical back: She exhibits normal range of motion and no tenderness.  Tenderness upon palpation of 3,4-5th costochondral left joints.  Lymphadenopathy:    She has no cervical adenopathy.  Neurological: She is alert and oriented to person, place, and time. She has normal strength. No cranial nerve deficit. Gait normal.  Skin: Skin is warm. No rash noted. No erythema.  Psychiatric: She has a normal mood and affect.  Well groomed, good eye contact.      ASSESSMENT AND PLAN:   Christine Barrera was seen today for chest pain.  Diagnoses and all orders for this visit: Lab Results  Component Value Date   TSH 6.89 (H) 09/18/2018   Lab Results  Component Value Date   CHOL 122 09/18/2018   HDL 36.80 (L) 09/18/2018   LDLCALC 72 09/18/2018   TRIG 66.0 09/18/2018   CHOLHDL 3 09/18/2018     Chest pain, unspecified type  We discussed possible etiologies. Explained that the likelihood of cardiac etiology is low but never zero. Most likely musculoskeletal. EKG was done today: SR,normal axis and intervals. No EKG available for comparison. Clearly instructed about warning signs.  -     EKG 12-Lead  Primary hypothyroidism  No changes in current management, will follow labs done today and will give further recommendations  accordingly.  -     TSH -     Lipid panel  Costochondritis  Educated about Dx and treatment options. She has Ibuprofen listed on allergy meds. She agrees with trying topical Diclofenac,which may help. Recommend avoiding shallow breathing.  -     diclofenac sodium (VOLTAREN) 1 % GEL; Apply 4 g topically 4 (four) times daily.  Bilateral shoulder pain, unspecified chronicity  Examination today negative. ? Rotator cuff tendinitis. Topical Diclofenac qid as needed and ROM exercises recommended. F/U as needed.  -     diclofenac sodium (VOLTAREN) 1 % GEL; Apply 4 g topically 4 (four) times daily.      Return in about 1 year (around 09/19/2019), or if symptoms worsen or fail to improve,  for routine.      Ryshawn Sanzone G. [place], MD  [facility]. [place] office.

## 2018-09-21 ENCOUNTER — Encounter: Payer: Self-pay | Admitting: Family Medicine

## 2018-09-21 MED ORDER — LEVOTHYROXINE SODIUM 25 MCG PO TABS: ORAL_TABLET | ORAL | 1 refills | Status: DC

## 2019-03-25 ENCOUNTER — Other Ambulatory Visit: Payer: Self-pay | Admitting: Family Medicine

## 2019-03-25 DIAGNOSIS — E039 Hypothyroidism, unspecified: Secondary | ICD-10-CM

## 2019-09-22 ENCOUNTER — Other Ambulatory Visit: Payer: Self-pay | Admitting: Family Medicine

## 2019-09-22 DIAGNOSIS — E039 Hypothyroidism, unspecified: Secondary | ICD-10-CM

## 2020-03-12 ENCOUNTER — Other Ambulatory Visit: Payer: Self-pay | Admitting: Family Medicine

## 2020-03-12 DIAGNOSIS — E039 Hypothyroidism, unspecified: Secondary | ICD-10-CM

## 2020-06-08 ENCOUNTER — Other Ambulatory Visit: Payer: Self-pay | Admitting: Family Medicine

## 2020-06-08 DIAGNOSIS — E039 Hypothyroidism, unspecified: Secondary | ICD-10-CM

## 2020-06-22 ENCOUNTER — Encounter: Payer: Self-pay | Admitting: Family Medicine

## 2020-07-27 NOTE — Progress Notes (Signed)
HPI: Christine Barrera is a 34 y.o. female, who is here today for chronic disease management.  She was last seen on 09/18/18.  Hypothyroidism: She is on Levothyroxine 25 mcg daily. She is taking medication daily as instructed.  Negative for fever,palpitations,changes in bowel habits,tremor,or heat/cold intolerance.  Lab Results  Component Value Date   TSH 6.89 (H) 09/18/2018   She has not been consistent with following a healthful diet. Walks sometimes.  She is also requesting other labs today. C/O fatigue,worse at the end of the day.  She feels rested in the morning. No known Hx of OSA. Iron deficiency anemia: She is not taking iron supplementation. Negative for pica.  Lab Results  Component Value Date   WBC 6.5 03/18/2018   HGB 9.6 (L) 03/18/2018   HCT 32.2 (L) 03/18/2018   MCV 63.2 Repeated and verified X2. (L) 03/18/2018   PLT 380.0 03/18/2018   Denies heavy menses. Her father dies 15 days ago, Hx of leukemia.  Vit D deficiency: She does not take vit D supplementation daily. 03/18/18 25 OH vit D 5.46.  Vit B12 deficiency: She is not on B12 supplementation. B12 02/2018 was 205.   Review of Systems  Constitutional: Negative for activity change and appetite change.  HENT: Negative for mouth sores, nosebleeds and sore throat.   Respiratory: Negative for cough, shortness of breath and wheezing.   Cardiovascular: Negative for chest pain and leg swelling.  Gastrointestinal: Negative for abdominal pain, nausea and vomiting.  Endocrine: Negative for polydipsia, polyphagia and polyuria.  Musculoskeletal: Negative for back pain and myalgias.  Neurological: Negative for syncope, weakness and headaches.  Hematological: Negative for adenopathy. Does not bruise/bleed easily.  Rest of ROS, see pertinent positives sand negatives in HPI  Current Outpatient Medications on File Prior to Visit  Medication Sig Dispense Refill  . diclofenac sodium (VOLTAREN) 1 % GEL  Apply 4 g topically 4 (four) times daily. 4 Tube 3  . ondansetron (ZOFRAN) 4 MG tablet Take by mouth.     No current facility-administered medications on file prior to visit.    Past Medical History:  Diagnosis Date  . Anemia   . Thyroid disease    Allergies  Allergen Reactions  . Ibuprofen Swelling   Social History   Socioeconomic History  . Marital status: Unknown    Spouse name: Not on file  . Number of children: 1  . Years of education: Not on file  . Highest education level: Not on file  Occupational History  . Not on file  Tobacco Use  . Smoking status: Never Smoker  . Smokeless tobacco: Never Used  Vaping Use  . Vaping Use: Never used  Substance and Sexual Activity  . Alcohol use: No    Alcohol/week: 0.0 standard drinks  . Drug use: Never  . Sexual activity: Not Currently    Birth control/protection: Condom  Other Topics Concern  . Not on file  Social History Narrative  . Not on file   Social Determinants of Health   Financial Resource Strain:   . Difficulty of Paying Living Expenses:   Food Insecurity:   . Worried About Programme researcher, broadcasting/film/video in the Last Year:   . Barista in the Last Year:   Transportation Needs:   . Freight forwarder (Medical):   Marland Kitchen Lack of Transportation (Non-Medical):   Physical Activity:   . Days of Exercise per Week:   . Minutes of Exercise per Session:  Stress:   . Feeling of Stress :   Social Connections:   . Frequency of Communication with Friends and Family:   . Frequency of Social Gatherings with Friends and Family:   . Attends Religious Services:   . Active Member of Clubs or Organizations:   . Attends Banker Meetings:   Marland Kitchen Marital Status:    Vitals:   07/28/20 0734  BP: 124/70  Pulse: (!) 104  Resp: 16  Temp: 98 F (36.7 C)  SpO2: 98%   Body mass index is 47.74 kg/m.  Physical Exam Vitals and nursing note reviewed.  Constitutional:      General: She is not in acute distress.     Appearance: She is well-developed.  HENT:     Head: Normocephalic and atraumatic.     Mouth/Throat:     Mouth: Mucous membranes are moist.     Pharynx: Oropharynx is clear.  Eyes:     Conjunctiva/sclera: Conjunctivae normal.     Pupils: Pupils are equal, round, and reactive to light.  Neck:     Thyroid: No thyromegaly.  Cardiovascular:     Rate and Rhythm: Regular rhythm. Tachycardia present.     Heart sounds: No murmur heard.   Pulmonary:     Effort: Pulmonary effort is normal. No respiratory distress.     Breath sounds: Normal breath sounds.  Abdominal:     Palpations: Abdomen is soft. There is no hepatomegaly or mass.     Tenderness: There is no abdominal tenderness.  Lymphadenopathy:     Cervical: No cervical adenopathy.  Skin:    General: Skin is warm.     Findings: No erythema or rash.  Neurological:     Mental Status: She is alert and oriented to person, place, and time.     Cranial Nerves: No cranial nerve deficit.     Gait: Gait normal.  Psychiatric:     Comments: Well groomed, good eye contact.    ASSESSMENT AND PLAN:  [name] was seen today for chronic disease management.  Orders Placed This Encounter  Procedures  . TSH  . Hepatitis C antibody  . CBC  . Vitamin B12  . VITAMIN D 25 Hydroxy (Vit-D Deficiency, Fractures)  . Iron  . Comprehensive metabolic panel   Lab Results  Component Value Date   TSH 6.45 (H) 07/28/2020   Lab Results  Component Value Date   VITAMINB12 414 07/28/2020   Lab Results  Component Value Date   WBC 7.0 07/28/2020   HGB 9.1 (L) 07/28/2020   HCT 32.1 (L) 07/28/2020   MCV 64.5 (L) 07/28/2020   PLT 407 (H) 07/28/2020   Lab Results  Component Value Date   ALT 15 07/28/2020   AST 16 07/28/2020   BILITOT 0.5 07/28/2020   Lab Results  Component Value Date   CREATININE 0.62 07/28/2020   BUN 8 07/28/2020   NA 137 07/28/2020   K 4.3 07/28/2020   CL 104 07/28/2020   CO2 23 07/28/2020    Fatigue,  unspecified type Some of her chronic problems most likely contributing factors. A healthful diet and regular physical activity may help.  Iron deficiency anemia She is not on iron supplementation. Further recommendations according to CBC.  Vitamin D deficiency, unspecified She has not been consistent with vit D supplementation. Further recommendations according to 25 OH vit D.  Primary hypothyroidism No changes in current management, will follow labs done today and will give further recommendations accordingly.  B12 deficiency Further recommendations according to B12 results.  Encounter for HCV screening test for low risk patient - Hepatitis C antibody  Return in about 1 year (around 07/28/2021).   Paco Cislo G. [place], MD  [facility]. [place] office.

## 2020-07-28 ENCOUNTER — Encounter: Payer: Self-pay | Admitting: Family Medicine

## 2020-07-28 ENCOUNTER — Other Ambulatory Visit: Payer: Self-pay

## 2020-07-28 ENCOUNTER — Ambulatory Visit (INDEPENDENT_AMBULATORY_CARE_PROVIDER_SITE_OTHER): Payer: 59 | Admitting: Family Medicine

## 2020-07-28 ENCOUNTER — Ambulatory Visit: Payer: 59 | Admitting: Family Medicine

## 2020-07-28 VITALS — BP 124/70 | HR 104 | Temp 98.0°F | Resp 16 | Ht 62.0 in | Wt 261.0 lb

## 2020-07-28 DIAGNOSIS — Z1159 Encounter for screening for other viral diseases: Secondary | ICD-10-CM

## 2020-07-28 DIAGNOSIS — E559 Vitamin D deficiency, unspecified: Secondary | ICD-10-CM

## 2020-07-28 DIAGNOSIS — D509 Iron deficiency anemia, unspecified: Secondary | ICD-10-CM

## 2020-07-28 DIAGNOSIS — E538 Deficiency of other specified B group vitamins: Secondary | ICD-10-CM | POA: Diagnosis not present

## 2020-07-28 DIAGNOSIS — R5383 Other fatigue: Secondary | ICD-10-CM

## 2020-07-28 DIAGNOSIS — E039 Hypothyroidism, unspecified: Secondary | ICD-10-CM

## 2020-07-28 DIAGNOSIS — R771 Abnormality of globulin: Secondary | ICD-10-CM

## 2020-07-28 NOTE — Assessment & Plan Note (Signed)
She has not been consistent with vit D supplementation. Further recommendations according to 25 OH vit D.

## 2020-07-28 NOTE — Assessment & Plan Note (Signed)
She is not on iron supplementation. Further recommendations according to CBC.

## 2020-07-28 NOTE — Assessment & Plan Note (Signed)
No changes in current management, will follow labs done today and will give further recommendations accordingly.  

## 2020-07-28 NOTE — Patient Instructions (Signed)
A few things to remember from today's visit:  Primary hypothyroidism - Plan: TSH  Encounter for HCV screening test for low risk patient - Plan: Hepatitis C antibody  Screening for lipid disorders  Vitamin D deficiency, unspecified - Plan: VITAMIN D 25 Hydroxy (Vit-D Deficiency, Fractures)  B12 deficiency - Plan: Vitamin B12  Iron deficiency anemia, unspecified iron deficiency anemia type - Plan: CBC, Iron  Consistency with regular physical activity, 15 min of daily walking. Count calories and limit to 1800 cal/day.   Fatigue If you have fatigue, you feel tired all the time and have a lack of energy or a lack of motivation. Fatigue may make it difficult to start or complete tasks because of exhaustion. In general, occasional or mild fatigue is often a normal response to activity or life. However, long-lasting (chronic) or extreme fatigue may be a symptom of a medical condition. Follow these instructions at home: General instructions  Watch your fatigue for any changes.  Go to bed and get up at the same time every day.  Avoid fatigue by pacing yourself during the day and getting enough sleep at night.  Maintain a healthy weight. Medicines  Take over-the-counter and prescription medicines only as told by your health care provider.  Take a multivitamin, if told by your health care provider.  Do not use herbal or dietary supplements unless they are approved by your health care provider. Activity   Exercise regularly, as told by your health care provider.  Use or practice techniques to help you relax, such as yoga, tai chi, meditation, or massage therapy. Eating and drinking   Avoid heavy meals in the evening.  Eat a well-balanced diet, which includes lean proteins, whole grains, plenty of fruits and vegetables, and low-fat dairy products.  Avoid consuming too much caffeine.  Avoid the use of alcohol.  Drink enough fluid to keep your urine pale  yellow. Lifestyle  Change situations that cause you stress. Try to keep your work and personal schedule in balance.  Do not use any products that contain nicotine or tobacco, such as cigarettes and e-cigarettes. If you need help quitting, ask your health care provider.  Do not use drugs. Contact a health care provider if:  Your fatigue does not get better.  You have a fever.  You suddenly lose or gain weight.  You have headaches.  You have trouble falling asleep or sleeping through the night.  You feel angry, guilty, anxious, or sad.  You are unable to have a bowel movement (constipation).  Your skin is dry.  You have swelling in your legs or another part of your body. Get help right away if:  You feel confused.  Your vision is blurry.  You feel faint or you pass out.  You have a severe headache.  You have severe pain in your abdomen, your back, or the area between your waist and hips (pelvis).  You have chest pain, shortness of breath, or an irregular or fast heartbeat.  You are unable to urinate, or you urinate less than normal.  You have abnormal bleeding, such as bleeding from the rectum, vagina, nose, lungs, or nipples.  You vomit blood.  You have thoughts about hurting yourself or others. If you ever feel like you may hurt yourself or others, or have thoughts about taking your own life, get help right away. You can go to your nearest emergency department or call:  Your local emergency services (911 in the [place]  A suicide crisis helpline,  such as the [facility] at 562-337-4255. This is open 24 hours a day. Summary  If you have fatigue, you feel tired all the time and have a lack of energy or a lack of motivation.  Fatigue may make it difficult to start or complete tasks because of exhaustion.  Long-lasting (chronic) or extreme fatigue may be a symptom of a medical condition.  Exercise regularly, as told by your health  care provider.  Change situations that cause you stress. Try to keep your work and personal schedule in balance. This information is not intended to replace advice given to you by your health care provider. Make sure you discuss any questions you have with your health care provider. Document Revised: 07/02/2019 Document Reviewed: 09/05/2017 Elsevier Patient Education  [employer].  If you need refills please call your pharmacy. Do not use My Chart to request refills or for acute issues that need immediate attention.    Please be sure medication list is accurate. If a new problem present, please set up appointment sooner than planned today.

## 2020-07-28 NOTE — Assessment & Plan Note (Signed)
Further recommendations according to B12 results.

## 2020-07-29 LAB — COMPREHENSIVE METABOLIC PANEL
AG Ratio: 1 (calc) (ref 1.0–2.5)
ALT: 15 U/L (ref 6–29)
AST: 16 U/L (ref 10–30)
Albumin: 4 g/dL (ref 3.6–5.1)
Alkaline phosphatase (APISO): 72 U/L (ref 31–125)
BUN: 8 mg/dL (ref 7–25)
CO2: 23 mmol/L (ref 20–32)
Calcium: 8.9 mg/dL (ref 8.6–10.2)
Chloride: 104 mmol/L (ref 98–110)
Creat: 0.62 mg/dL (ref 0.50–1.10)
Globulin: 4 g/dL (calc) — ABNORMAL HIGH (ref 1.9–3.7)
Glucose, Bld: 92 mg/dL (ref 65–99)
Potassium: 4.3 mmol/L (ref 3.5–5.3)
Sodium: 137 mmol/L (ref 135–146)
Total Bilirubin: 0.5 mg/dL (ref 0.2–1.2)
Total Protein: 8 g/dL (ref 6.1–8.1)

## 2020-07-29 LAB — CBC
HCT: 32.1 % — ABNORMAL LOW (ref 35.0–45.0)
Hemoglobin: 9.1 g/dL — ABNORMAL LOW (ref 11.7–15.5)
MCH: 18.3 pg — ABNORMAL LOW (ref 27.0–33.0)
MCHC: 28.3 g/dL — ABNORMAL LOW (ref 32.0–36.0)
MCV: 64.5 fL — ABNORMAL LOW (ref 80.0–100.0)
MPV: 10.9 fL (ref 7.5–12.5)
Platelets: 407 10*3/uL — ABNORMAL HIGH (ref 140–400)
RBC: 4.98 10*6/uL (ref 3.80–5.10)
RDW: 19.8 % — ABNORMAL HIGH (ref 11.0–15.0)
WBC: 7 10*3/uL (ref 3.8–10.8)

## 2020-07-29 LAB — VITAMIN D 25 HYDROXY (VIT D DEFICIENCY, FRACTURES): Vit D, 25-Hydroxy: 11 ng/mL — ABNORMAL LOW (ref 30–100)

## 2020-07-29 LAB — HEPATITIS C ANTIBODY
Hepatitis C Ab: NONREACTIVE
SIGNAL TO CUT-OFF: 0.02 (ref <1.00)

## 2020-07-29 LAB — TSH: TSH: 6.45 mIU/L — ABNORMAL HIGH

## 2020-07-29 LAB — VITAMIN B12: Vitamin B-12: 414 pg/mL (ref 200–1100)

## 2020-07-29 LAB — IRON: Iron: 29 ug/dL — ABNORMAL LOW (ref 40–190)

## 2020-07-31 ENCOUNTER — Encounter: Payer: Self-pay | Admitting: Family Medicine

## 2020-07-31 MED ORDER — LEVOTHYROXINE SODIUM 50 MCG PO TABS: 50.0000 ug | ORAL_TABLET | Freq: Every day | ORAL | 1 refills | Status: DC

## 2020-07-31 MED ORDER — FERROUS SULFATE 325 (65 FE) MG PO TABS: 325.0000 mg | ORAL_TABLET | Freq: Every day | ORAL | 3 refills | Status: DC

## 2020-07-31 MED ORDER — VITAMIN D (ERGOCALCIFEROL) 1.25 MG (50000 UNIT) PO CAPS: 50000.0000 [IU] | ORAL_CAPSULE | ORAL | 0 refills | Status: AC

## 2020-09-09 ENCOUNTER — Other Ambulatory Visit: Payer: 59

## 2020-09-27 ENCOUNTER — Other Ambulatory Visit: Payer: Self-pay

## 2020-09-27 ENCOUNTER — Other Ambulatory Visit (INDEPENDENT_AMBULATORY_CARE_PROVIDER_SITE_OTHER): Payer: 59

## 2020-09-27 DIAGNOSIS — R771 Abnormality of globulin: Secondary | ICD-10-CM

## 2020-09-27 DIAGNOSIS — D509 Iron deficiency anemia, unspecified: Secondary | ICD-10-CM

## 2020-09-27 DIAGNOSIS — E039 Hypothyroidism, unspecified: Secondary | ICD-10-CM

## 2020-09-27 DIAGNOSIS — R5383 Other fatigue: Secondary | ICD-10-CM

## 2020-09-29 LAB — PROTEIN,TOTAL AND PROTEIN ELECTROPHORESIS, RANDOM URINE(REFL)
Albumin: 41 %
Alpha-1-Globulin, U: 3 %
Alpha-2-Globulin, U: 19 %
Beta Globulin, U: 23 %
Creatinine, Urine: 174 mg/dL (ref 20–275)
Gamma Globulin, U: 14 %
Protein/Creat Ratio: 92 mg/g creat (ref 21–161)
Protein/Creatinine Ratio: 0.092 mg/mg creat (ref 0.021–0.16)
Total Protein, Urine: 16 mg/dL (ref 5–24)

## 2020-09-29 LAB — CBC
HCT: 33.9 % — ABNORMAL LOW (ref 35.0–45.0)
Hemoglobin: 9.7 g/dL — ABNORMAL LOW (ref 11.7–15.5)
MCH: 19.4 pg — ABNORMAL LOW (ref 27.0–33.0)
MCHC: 28.6 g/dL — ABNORMAL LOW (ref 32.0–36.0)
MCV: 67.7 fL — ABNORMAL LOW (ref 80.0–100.0)
Platelets: 363 10*3/uL (ref 140–400)
RBC: 5.01 10*6/uL (ref 3.80–5.10)
RDW: 21.2 % — ABNORMAL HIGH (ref 11.0–15.0)
WBC: 6.4 10*3/uL (ref 3.8–10.8)

## 2020-09-29 LAB — TSH: TSH: 6.46 mIU/L — ABNORMAL HIGH

## 2020-10-11 MED ORDER — SYNTHROID 75 MCG PO TABS: 75.0000 ug | ORAL_TABLET | Freq: Every day | ORAL | 0 refills | Status: DC

## 2020-10-11 NOTE — Addendum Note (Signed)
Addended by: [place], Ziomara Birenbaum G on: 10/11/2020 10:06 AM   Modules accepted: Orders

## 2020-11-29 ENCOUNTER — Other Ambulatory Visit: Payer: 59

## 2020-12-01 ENCOUNTER — Ambulatory Visit (INDEPENDENT_AMBULATORY_CARE_PROVIDER_SITE_OTHER): Payer: 59 | Admitting: Family Medicine

## 2020-12-01 ENCOUNTER — Ambulatory Visit: Payer: 59 | Admitting: Family Medicine

## 2020-12-01 ENCOUNTER — Encounter: Payer: Self-pay | Admitting: Family Medicine

## 2020-12-01 ENCOUNTER — Other Ambulatory Visit: Payer: Self-pay

## 2020-12-01 ENCOUNTER — Ambulatory Visit (INDEPENDENT_AMBULATORY_CARE_PROVIDER_SITE_OTHER): Payer: 59

## 2020-12-01 VITALS — BP 128/76 | HR 105 | Temp 98.9°F | Resp 12 | Ht 62.0 in | Wt 262.8 lb

## 2020-12-01 DIAGNOSIS — M545 Low back pain, unspecified: Secondary | ICD-10-CM

## 2020-12-01 DIAGNOSIS — M79604 Pain in right leg: Secondary | ICD-10-CM | POA: Diagnosis not present

## 2020-12-01 MED ORDER — TRAMADOL HCL 50 MG PO TABS: 50.0000 mg | ORAL_TABLET | Freq: Two times a day (BID) | ORAL | 0 refills | Status: AC | PRN

## 2020-12-01 NOTE — Patient Instructions (Addendum)
A few things to remember from today's visit:   Acute right-sided low back pain, unspecified whether sciatica present - Plan: traMADol (ULTRAM) 50 MG tablet, DG Lumbar Spine Complete  Pain of right lower extremity  If you need refills please call your pharmacy. Do not use My Chart to request refills or for acute issues that need immediate attention.   Tramadol can cause drowsiness. If leg pain still present in 4-6 weeks lumbar MRI can be considered. Icy hot patch on area of pain may also help.  Please be sure medication list is accurate. If a new problem present, please set up appointment sooner than planned today.

## 2020-12-01 NOTE — Progress Notes (Signed)
Chief Complaint  Patient presents with  . thigh pain   HPI: [name] is a 34 y.o. female with hx of obesity, iron def anemia,vit D def,and hypothyroidism here today complaining of gradual onset of anterior thigh pain. Pain started on 11/16/20. No Hx of trauma,surgery,or long travel.  Right-sided low back and RLE shooting like "extreme pain." "Little" swelling and local heat on lateral aspect of right hip. She has not noted erythema. Initially pain was constant, now it is intermittent. Exacerbated by long walking/standing. Pain is 9-10/10.  Pain has been stable for the past 2 days. Now pain is 3-4/10.   No associated saddle anesthesia,or bowel/bladder dysfunction. Negative for numbness,tingling,or burning.  She has not tried OTC medications. She is very concerned about serious illness, she soul like imaging done.  She has not noted fever,chills,night sweats, abnormal wt loss, abdominal pain,changes in bowel habits,or urinary symptoms.  Review of Systems  Constitutional: Positive for fatigue. Negative for appetite change.  HENT: Negative for mouth sores and nosebleeds.   Respiratory: Negative for cough, shortness of breath and wheezing.   Cardiovascular: Negative for chest pain, palpitations and leg swelling.  Genitourinary: Negative for decreased urine volume, dysuria and hematuria.  Neurological: Negative for syncope and weakness.  Psychiatric/Behavioral: Positive for sleep disturbance. Negative for confusion.  Rest see pertinent positives and negatives per HPI.  Current Outpatient Medications on File Prior to Visit  Medication Sig Dispense Refill  . diclofenac sodium (VOLTAREN) 1 % GEL Apply 4 g topically 4 (four) times daily. 4 Tube 3  . ferrous sulfate 325 (65 FE) MG tablet Take 1 tablet (325 mg total) by mouth daily. 90 tablet 3  . ondansetron (ZOFRAN) 4 MG tablet Take by mouth.    . SYNTHROID 75 MCG tablet Take 1 tablet (75 mcg total) by mouth daily  before breakfast. 90 tablet 0   No current facility-administered medications on file prior to visit.   Past Medical History:  Diagnosis Date  . Anemia   . Thyroid disease    Allergies  Allergen Reactions  . Ibuprofen Swelling   Social History   Socioeconomic History  . Marital status: Unknown    Spouse name: Not on file  . Number of children: 1  . Years of education: Not on file  . Highest education level: Not on file  Occupational History  . Not on file  Tobacco Use  . Smoking status: Never Smoker  . Smokeless tobacco: Never Used  Vaping Use  . Vaping Use: Never used  Substance and Sexual Activity  . Alcohol use: No    Alcohol/week: 0.0 standard drinks  . Drug use: Never  . Sexual activity: Not Currently    Birth control/protection: Condom  Other Topics Concern  . Not on file  Social History Narrative  . Not on file   Social Determinants of Health   Financial Resource Strain: Not on file  Food Insecurity: Not on file  Transportation Needs: Not on file  Physical Activity: Not on file  Stress: Not on file  Social Connections: Not on file    Vitals:   12/01/20 0836  BP: 128/76  Pulse: (!) 105  Resp: 12  Temp: 98.9 F (37.2 C)  SpO2: 98%   Body mass index is 48.07 kg/m.  Physical Exam Vitals and nursing note reviewed.  Constitutional:      General: She is not in acute distress.    Appearance: She is well-developed. She is not ill-appearing.  HENT:  Head: Normocephalic and atraumatic.  Eyes:     Conjunctiva/sclera: Conjunctivae normal.  Cardiovascular:     Rate and Rhythm: Normal rate and regular rhythm.     Comments: DP pulses present. HR: 96/min There is no tenderness upon palpation of right calf or with foot dorsiflexion. Pulmonary:     Effort: Pulmonary effort is normal. No respiratory distress.     Breath sounds: Normal breath sounds.  Chest:  Breasts:     Right: No supraclavicular adenopathy.     Left: No supraclavicular  adenopathy.    Abdominal:     Palpations: Abdomen is soft. There is no hepatomegaly or mass.     Tenderness: There is no abdominal tenderness.  Musculoskeletal:     Lumbar back: Tenderness present. No spasms or bony tenderness. Negative right straight leg raise test and negative left straight leg raise test.       Back:     Right hip: Tenderness present. No bony tenderness. Normal range of motion.     Right lower leg: No edema.     Left lower leg: No edema.       Legs:     Comments: No significant deformity appreciated. +Tenderness upon palpation of SI. Pain elicited with movement on exam table during examination. No local edema or erythema appreciated, no suspicious lesions.    Lymphadenopathy:     Cervical: No cervical adenopathy.     Upper Body:     Right upper body: No supraclavicular adenopathy.     Left upper body: No supraclavicular adenopathy.  Skin:    General: Skin is warm.     Findings: No erythema or rash.  Neurological:     Mental Status: She is alert and oriented to person, place, and time.     Coordination: Coordination normal.     Deep Tendon Reflexes:     Reflex Scores:      Patellar reflexes are 2+ on the right side and 2+ on the left side. Psychiatric:     Comments: Well groomed, good eye contact.     ASSESSMENT AND PLAN: Christine Barrera was seen today for thigh pain.  Diagnoses and all orders for this visit:  Acute right-sided low back pain, unspecified whether sciatica present Hx and examination do not suggest a serious process.  ? Sacroiliitis. Tramadol side effects discussed. Further recommendations according to X ray results.   -     traMADol (ULTRAM) 50 MG tablet; Take 1 tablet (50 mg total) by mouth every 12 (twelve) hours as needed for up to 7 days. -     DG Lumbar Spine Complete; Future  Pain of right lower extremity Anterior tight and lateral hip pain.  ? Radicular pain, trochanteric bursitis. She prefers to hold on steroid  treatment. Local ice. Tramadol 50 mg bid prn x 7 days. F/U as needed.  Return if symptoms worsen or fail to improve.   Hansika Leaming G. [place], MD  [facility] [place] office.   A few things to remember from today's visit:   Acute right-sided low back pain, unspecified whether sciatica present - Plan: traMADol (ULTRAM) 50 MG tablet, DG Lumbar Spine Complete  Pain of right lower extremity  If you need refills please call your pharmacy. Do not use My Chart to request refills or for acute issues that need immediate attention.   Tramadol can cause drowsiness. If leg pain still present in 4-6 weeks lumbar MRI can be considered. Icy hot patch on area of pain may also  help.  Please be sure medication list is accurate. If a new problem present, please set up appointment sooner than planned today.

## 2021-01-03 ENCOUNTER — Other Ambulatory Visit: Payer: Self-pay | Admitting: Family Medicine

## 2021-01-03 DIAGNOSIS — E039 Hypothyroidism, unspecified: Secondary | ICD-10-CM

## 2021-01-17 IMAGING — DX DG LUMBAR SPINE COMPLETE 4+V
5 series · 5 of 5 positions shown · non-contrast
Comparison: None.

CLINICAL DATA: Severe right-sided low back pain extending into the
buttock and thigh. No acute injury. Symptoms for 2 weeks.

EXAM:
LUMBAR SPINE - COMPLETE 4+ VIEW

[lumbar spine ap]
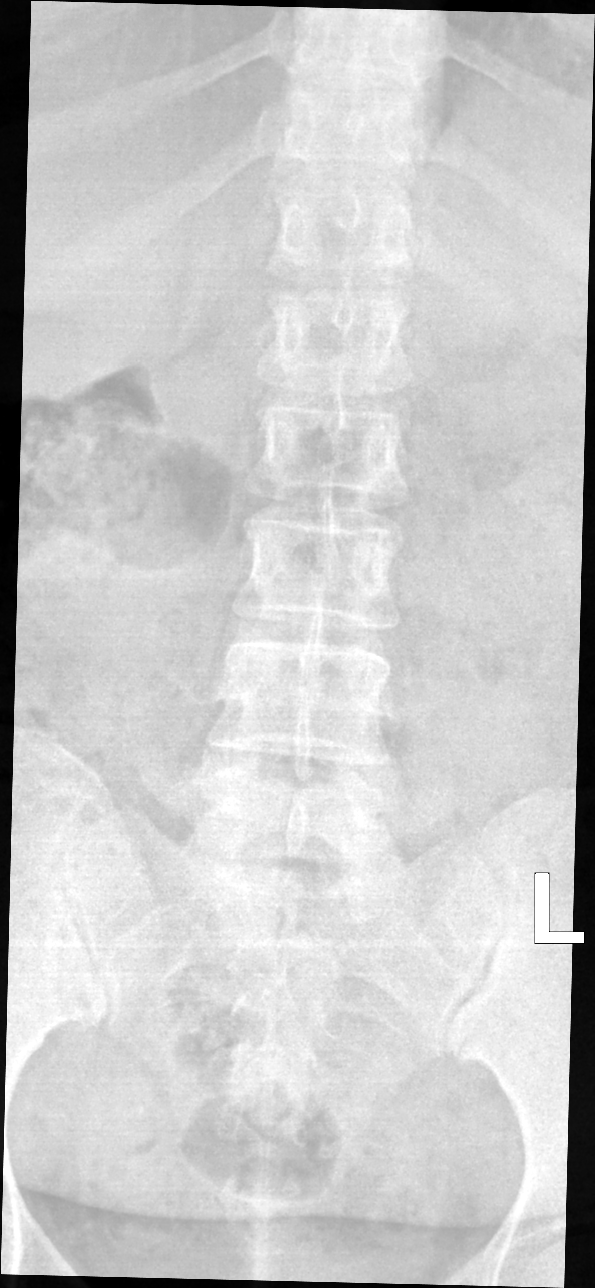

[lumbar spine oblique (1 of 2)]
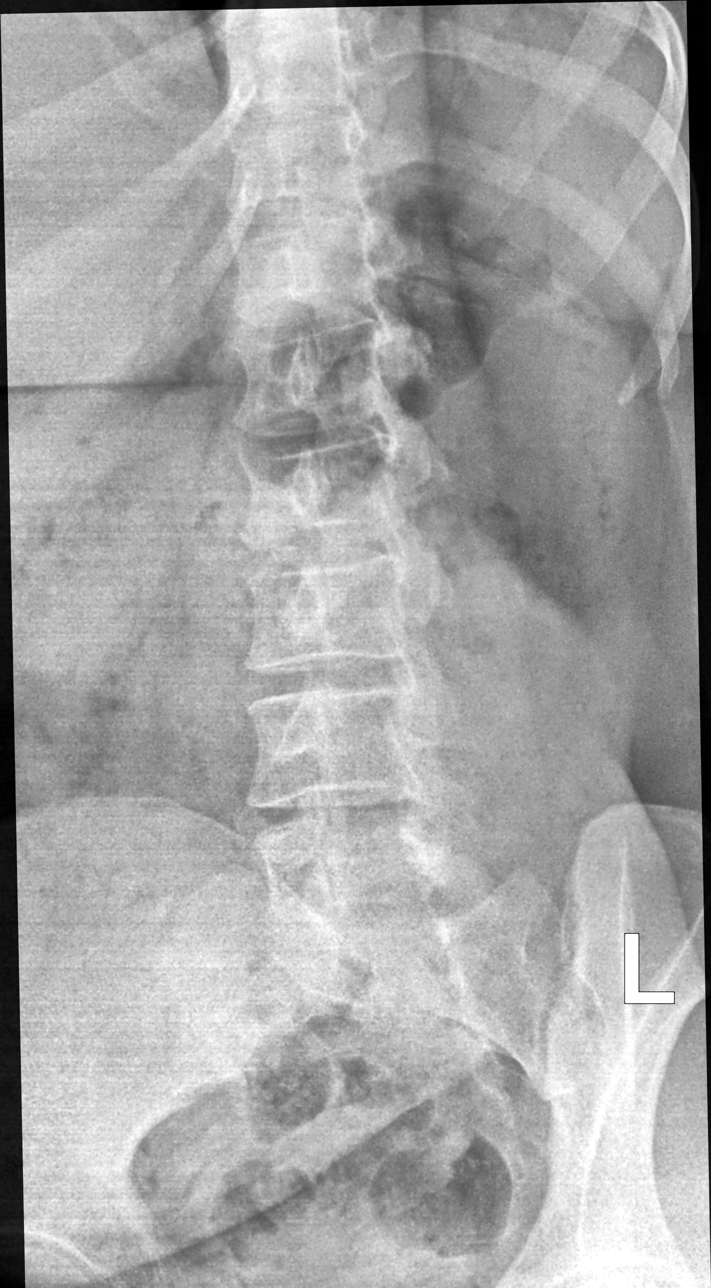

[lumbar spine oblique (2 of 2)]
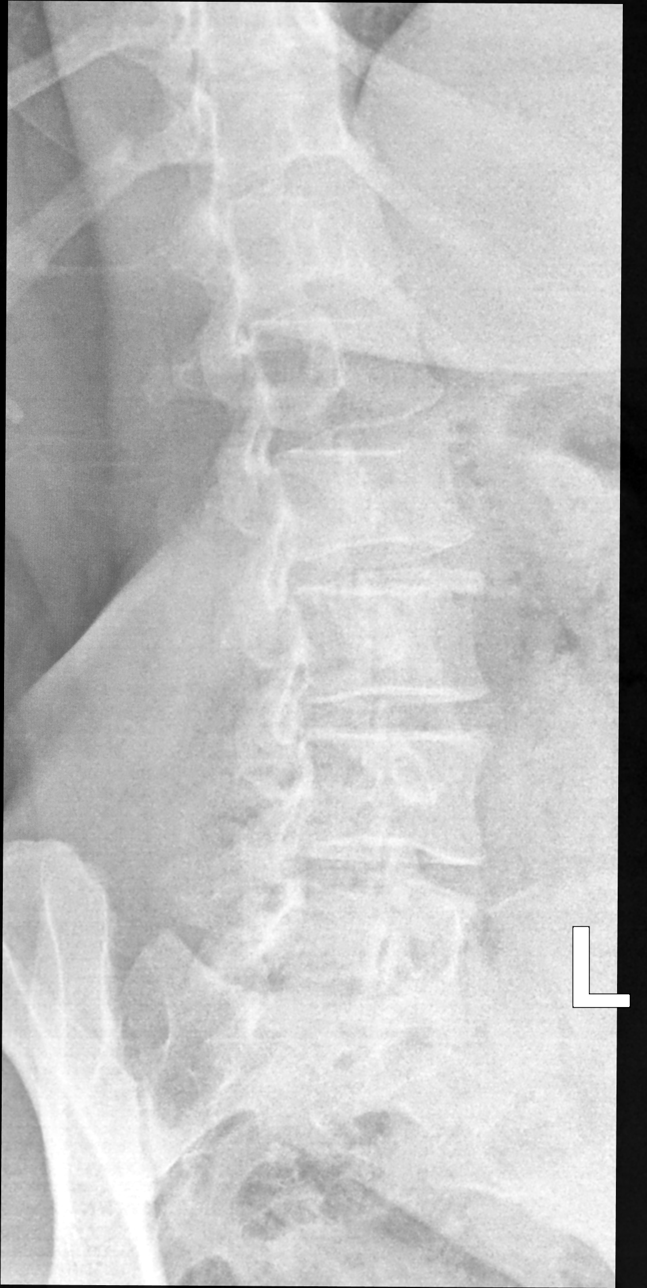

[lumbar spine lat]
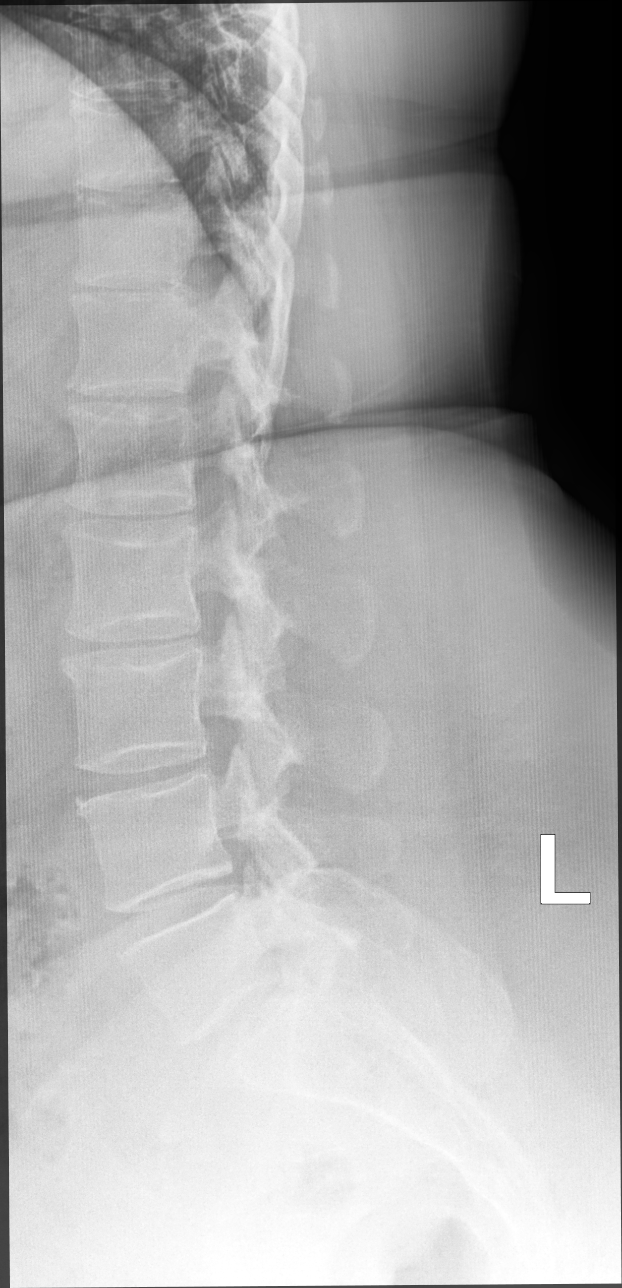

[lumbar spine lat spot]
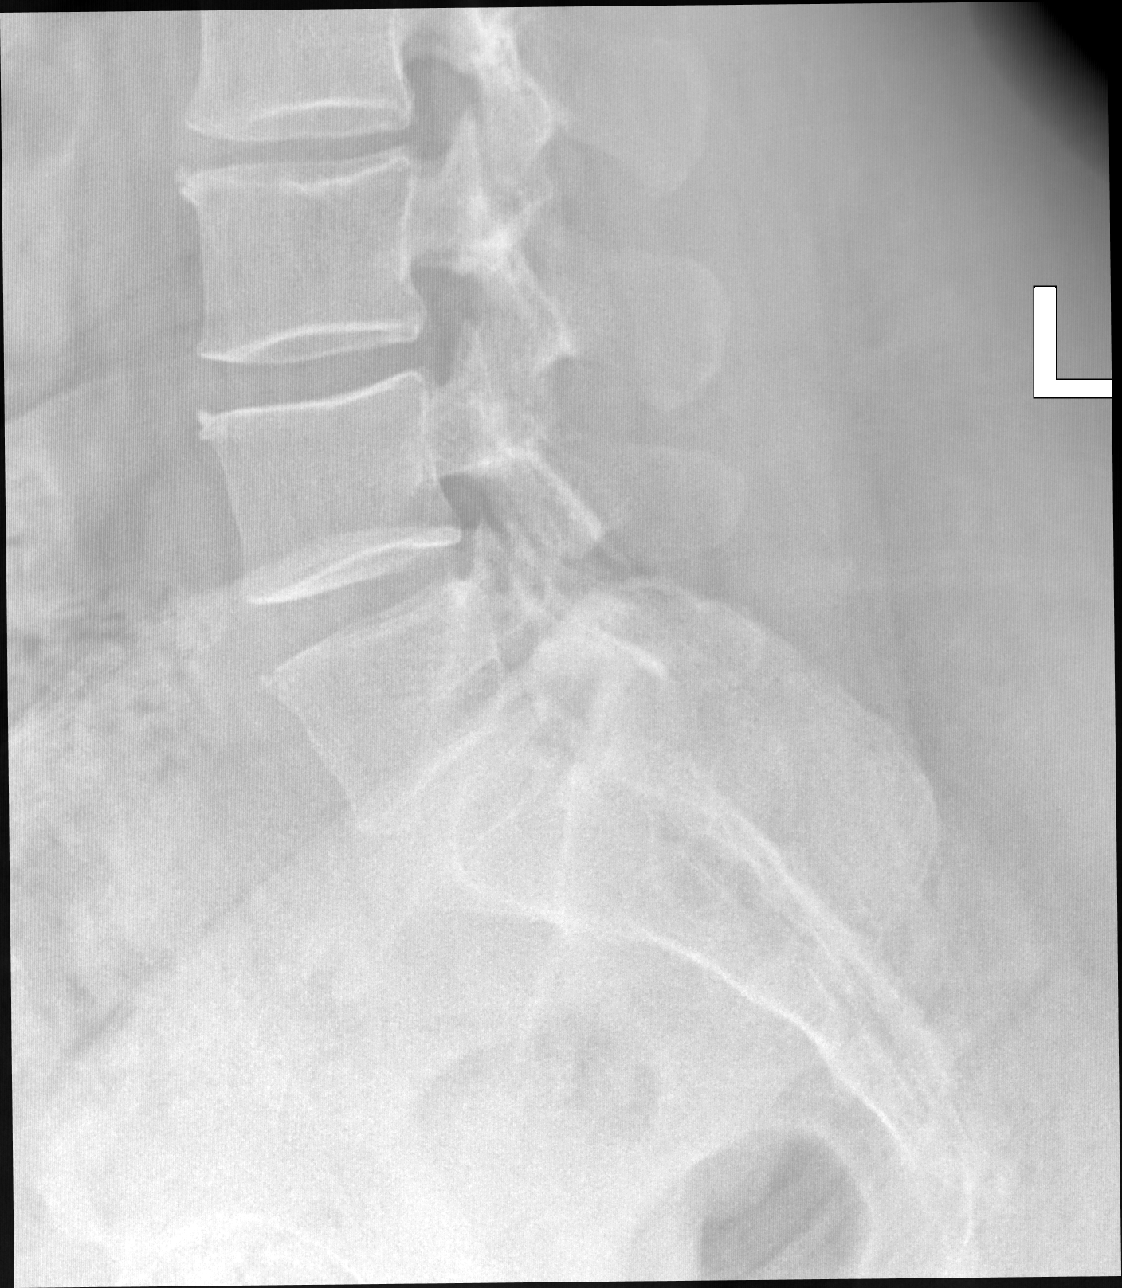

[5 of 5 positions shown; findings below may reference images not displayed]

FINDINGS: There appear to be small ribs at T12 and 5 lumbar type vertebral
bodies. The alignment is normal. There is no evidence of acute
fracture or pars defect. The disc spaces are preserved, although
there is mild intervertebral spurring throughout the lumbar spine
and mild facet hypertrophy inferiorly. The visualized sacroiliac
joints appear unremarkable.
IMPRESSION: No acute osseous findings or malalignment. Mild spondylosis.

## 2021-05-29 ENCOUNTER — Telehealth: Payer: 59 | Admitting: Family

## 2021-05-29 DIAGNOSIS — L509 Urticaria, unspecified: Secondary | ICD-10-CM

## 2021-05-29 MED ORDER — PREDNISONE 10 MG (21) PO TBPK: ORAL_TABLET | ORAL | 0 refills | Status: DC

## 2021-05-29 NOTE — Progress Notes (Signed)
E Visit for Rash  We are sorry that you are not feeling well. Here is how we plan to help!   Based on what you shared with me you may have a virus or an allergic reaction.  I recommend you take Benadryl 25 mg - 50 mg every 4 hours to control the symptoms but if they last over 24 hours it is best that you see an office based provider for follow up.   I have also sent a prednisone dose pack. It is very important to try to figure out what is causing your hives.   HOME CARE:   Take cool showers and avoid direct sunlight.  Apply cool compress or wet dressings.  Take a bath in an oatmeal bath.  Sprinkle content of one Aveeno packet under running faucet with comfortably warm water.  Bathe for 15-20 minutes, 1-2 times daily.  Pat dry with a towel. Do not rub the rash.  Use hydrocortisone cream.  Take an antihistamine like Benadryl for widespread rashes that itch.  The adult dose of Benadryl is 25-50 mg by mouth 4 times daily.  Caution:  This type of medication may cause sleepiness.  Do not drink alcohol, drive, or operate dangerous machinery while taking antihistamines.  Do not take these medications if you have prostate enlargement.  Read package instructions thoroughly on all medications that you take.  GET HELP RIGHT AWAY IF:   Symptoms don't go away after treatment.  Severe itching that persists.  If you rash spreads or swells.  If you rash begins to smell.  If it blisters and opens or develops a yellow-brown crust.  You develop a fever.  You have a sore throat.  You become short of breath.  MAKE SURE YOU:  Understand these instructions. Will watch your condition. Will get help right away if you are not doing well or get worse.  Thank you for choosing an e-visit. Your e-visit answers were reviewed by a board certified advanced clinical practitioner to complete your personal care plan. Depending upon the condition, your plan could have included both over the counter or  prescription medications. Please review your pharmacy choice. Be sure that the pharmacy you have chosen is open so that you can pick up your prescription now.  If there is a problem you may message your provider in MyChart to have the prescription routed to another pharmacy. Your safety is important to [place]. If you have drug allergies check your prescription carefully.  For the next 24 hours, you can use MyChart to ask questions about today's visit, request a non-urgent call back, or ask for a work or school excuse from your e-visit provider. You will get an email in the next two days asking about your experience. I hope that your e-visit has been valuable and will speed your recovery.    Approximately 5 minutes was spent documenting and reviewing patient's chart.

## 2021-09-14 ENCOUNTER — Other Ambulatory Visit: Payer: Self-pay | Admitting: Family Medicine

## 2021-09-14 DIAGNOSIS — D509 Iron deficiency anemia, unspecified: Secondary | ICD-10-CM

## 2022-01-16 ENCOUNTER — Other Ambulatory Visit: Payer: Self-pay | Admitting: Family Medicine

## 2022-01-16 DIAGNOSIS — E039 Hypothyroidism, unspecified: Secondary | ICD-10-CM

## 2022-04-20 ENCOUNTER — Other Ambulatory Visit: Payer: Self-pay | Admitting: Family Medicine

## 2022-04-20 DIAGNOSIS — E039 Hypothyroidism, unspecified: Secondary | ICD-10-CM

## 2022-12-29 ENCOUNTER — Encounter: Payer: Self-pay | Admitting: Family Medicine

## 2022-12-29 ENCOUNTER — Other Ambulatory Visit (HOSPITAL_COMMUNITY)
Admission: RE | Admit: 2022-12-29 | Discharge: 2022-12-29 | Disposition: A | Discharge status: Home / Self Care | Payer: 59 | Source: Ambulatory Visit | Attending: Family Medicine | Admitting: Family Medicine

## 2022-12-29 ENCOUNTER — Ambulatory Visit (INDEPENDENT_AMBULATORY_CARE_PROVIDER_SITE_OTHER): Payer: 59 | Admitting: Family Medicine

## 2022-12-29 VITALS — BP 134/80 | HR 100 | Temp 98.8°F | Resp 12 | Ht 62.0 in | Wt 271.4 lb

## 2022-12-29 DIAGNOSIS — E559 Vitamin D deficiency, unspecified: Secondary | ICD-10-CM | POA: Diagnosis not present

## 2022-12-29 DIAGNOSIS — E538 Deficiency of other specified B group vitamins: Secondary | ICD-10-CM

## 2022-12-29 DIAGNOSIS — E039 Hypothyroidism, unspecified: Secondary | ICD-10-CM

## 2022-12-29 DIAGNOSIS — E66813 Obesity, class 3: Secondary | ICD-10-CM | POA: Insufficient documentation

## 2022-12-29 DIAGNOSIS — N938 Other specified abnormal uterine and vaginal bleeding: Secondary | ICD-10-CM | POA: Insufficient documentation

## 2022-12-29 DIAGNOSIS — D500 Iron deficiency anemia secondary to blood loss (chronic): Secondary | ICD-10-CM

## 2022-12-29 LAB — BASIC METABOLIC PANEL
BUN: 9 mg/dL (ref 6–23)
CO2: 26 mEq/L (ref 19–32)
Calcium: 9.4 mg/dL (ref 8.4–10.5)
Chloride: 103 mEq/L (ref 96–112)
Creatinine, Ser: 0.55 mg/dL (ref 0.40–1.20)
GFR: 116.22 mL/min (ref >60.00)
Glucose, Bld: 94 mg/dL (ref 70–99)
Potassium: 4 mEq/L (ref 3.5–5.1)
Sodium: 138 mEq/L (ref 135–145)

## 2022-12-29 LAB — CBC
HCT: 30.7 % — ABNORMAL LOW (ref 36.0–46.0)
Hemoglobin: 9.1 g/dL — ABNORMAL LOW (ref 12.0–15.0)
MCHC: 29.7 g/dL — ABNORMAL LOW (ref 30.0–36.0)
MCV: 61.2 fl — ABNORMAL LOW (ref 78.0–100.0)
Platelets: 411 10*3/uL — ABNORMAL HIGH (ref 150.0–400.0)
RBC: 5.02 Mil/uL (ref 3.87–5.11)
RDW: 20.4 % — ABNORMAL HIGH (ref 11.5–15.5)
WBC: 7.1 10*3/uL (ref 4.0–10.5)

## 2022-12-29 LAB — VITAMIN B12: Vitamin B-12: 201 pg/mL — ABNORMAL LOW (ref 211–911)

## 2022-12-29 LAB — FERRITIN: Ferritin: 9.2 ng/mL — ABNORMAL LOW (ref 10.0–291.0)

## 2022-12-29 LAB — TSH: TSH: 12.32 u[IU]/mL — ABNORMAL HIGH (ref 0.35–5.50)

## 2022-12-29 LAB — POCT URINE PREGNANCY: Preg Test, Ur: NEGATIVE

## 2022-12-29 LAB — HEMOGLOBIN A1C: Hgb A1c MFr Bld: 5.9 % (ref 4.6–6.5)

## 2022-12-29 LAB — VITAMIN D 25 HYDROXY (VIT D DEFICIENCY, FRACTURES): VITD: 14.49 ng/mL — ABNORMAL LOW (ref 30.00–100.00)

## 2022-12-29 LAB — T4, FREE: Free T4: 0.69 ng/dL (ref 0.60–1.60)

## 2022-12-29 LAB — IRON: Iron: 19 ug/dL — ABNORMAL LOW (ref 42–145)

## 2022-12-29 MED ORDER — NORGESTIMATE-ETH ESTRADIOL 0.25-35 MG-MCG PO TABS: 1.0000 | ORAL_TABLET | Freq: Every day | ORAL | 0 refills | Status: DC

## 2022-12-29 NOTE — Progress Notes (Unsigned)
ACUTE VISIT Chief Complaint  Patient presents with   Menstrual Problem   HPI: Christine Barrera is a 37 y.o. female with PMHx significant for hypothyroidism and iron def anemia here today with her husband complaining of 3-4 weeks of vaginal bleeding. She was last seen on 12/01/2020. She is c/o irregular menstrual periods. She reports not having her period for three months, followed by spotting in November and a heavy flow for 10 days in December, which is still ongoing. The patient admits to taking her thyroid medication irregularly throughout the last year due to personal and work-related tensions. She denies any cravings for ice or flour but does report feeling tired sometimes.  She does not recall ever having a pap smear or a pelvic examination.   Vaginal Bleeding The patient's primary symptoms include vaginal bleeding. The patient's pertinent negatives include no genital itching, genital lesions, genital odor, genital rash, missed menses, pelvic pain or vaginal discharge. The current episode started more than 1 month ago. Associated symptoms include back pain. Pertinent negatives include no abdominal pain, chills, constipation, dysuria, fever, flank pain, frequency, headaches, hematuria, nausea, painful intercourse, rash, sore throat, urgency or vomiting.  She is sexually active and not currently using any form of birth control. She denies experiencing any pelvic pain, cramps, nausea, or vomiting, but does report occasional lower back pain.  Hypothyroidism: Synthroid 75 mcg, resumed a couple weeks ago, she is not taking medication daily. She has not noted palpitations, changes in bowel habits,or tremor. Lab Results  Component Value Date   TSH 6.46 (H) 09/27/2020   Iron def anemia: She has recently started taking iron tablets for the past five days. Negative for pica. She has not noted blood in stool or melena. Negative severe/frequent headache, visual changes, chest pain,  dyspnea, or palpitation.  Lab Results  Component Value Date   WBC 6.4 09/27/2020   HGB 9.7 (L) 09/27/2020   HCT 33.9 (L) 09/27/2020   MCV 67.7 (L) 09/27/2020   PLT 363 09/27/2020   B12 and vit D def, she is not on B12 or vit D supplementation.  Lab Results  Component Value Date   VITAMINB12 414 07/28/2020   She is not exercising regularly and has not been consistent with following a healthful diet. States that has been experiencing feelings of sadness and crying, which she attributes to her menstrual and general health issues.  + Fatigue. She sleeps an average of seven hours per night.    12/29/2022   10:08 AM 03/18/2018    9:55 AM  Depression screen PHQ 2/9  Decreased Interest 0 0  Down, Depressed, Hopeless 0 0  PHQ - 2 Score 0 0   Review of Systems  Constitutional:  Negative for chills and fever.  HENT:  Negative for mouth sores and sore throat.   Respiratory:  Negative for cough and wheezing.   Cardiovascular:  Negative for chest pain and leg swelling.  Gastrointestinal:  Negative for abdominal pain, constipation, nausea and vomiting.  Endocrine: Negative for cold intolerance, heat intolerance, polydipsia, polyphagia and polyuria.  Genitourinary:  Positive for vaginal bleeding. Negative for dysuria, flank pain, frequency, hematuria, missed menses, pelvic pain, urgency and vaginal discharge.  Musculoskeletal:  Positive for back pain.  Skin:  Negative for rash.  Neurological:  Negative for syncope, weakness and headaches.  Hematological:  Negative for adenopathy. Does not bruise/bleed easily.  Psychiatric/Behavioral:  Negative for confusion. The patient is nervous/anxious.   See other pertinent positives and negatives in HPI.  Current  Outpatient Medications on File Prior to Visit  Medication Sig Dispense Refill   ferrous sulfate 325 (65 FE) MG tablet TAKE 1 TABLET BY MOUTH EVERY DAY 90 tablet 1   SYNTHROID 75 MCG tablet Take 1 tablet (75 mcg total) by mouth daily before  breakfast. Please call [phone] to set up an appointment. 90 tablet 0   No current facility-administered medications on file prior to visit.   Past Medical History:  Diagnosis Date   Anemia    Thyroid disease    Allergies  Allergen Reactions   Ibuprofen Swelling   Social History   Socioeconomic History   Marital status: Married    Spouse name: Not on file   Number of children: 1   Years of education: Not on file   Highest education level: Bachelor's degree (e.g., BA, AB, BS)  Occupational History   Not on file  Tobacco Use   Smoking status: Never   Smokeless tobacco: Never  Vaping Use   Vaping Use: Never used  Substance and Sexual Activity   Alcohol use: No    Alcohol/week: 0.0 standard drinks of alcohol   Drug use: Never   Sexual activity: Not Currently    Birth control/protection: Condom  Other Topics Concern   Not on file  Social History Narrative   Not on file   Social Determinants of Health   Financial Resource Strain: Low Risk  (12/28/2022)   Overall Financial Resource Strain (CARDIA)    Difficulty of Paying Living Expenses: Not hard at all  Food Insecurity: No Food Insecurity (12/28/2022)   Hunger Vital Sign    Worried About Running Out of Food in the Last Year: Never true    Ran Out of Food in the Last Year: Never true  Transportation Needs: No Transportation Needs (12/28/2022)   PRAPARE - Hydrologist (Medical): No    Lack of Transportation (Non-Medical): No  Physical Activity: Insufficiently Active (12/28/2022)   Exercise Vital Sign    Days of Exercise per Week: 1 day    Minutes of Exercise per Session: 40 min  Stress: Stress Concern Present (12/28/2022)   [place]    Feeling of Stress : To some extent  Social Connections: Moderately Isolated (12/28/2022)   Social Connection and Isolation Panel [NHANES]    Frequency of Communication with Friends and Family:  More than three times a week    Frequency of Social Gatherings with Friends and Family: Once a week    Attends Religious Services: Never    Marine scientist or Organizations: No    Attends Music therapist: Not on file    Marital Status: Married   Today's Vitals   12/29/22 1007  BP: 134/80  Pulse: 100  Resp: 12  Temp: 98.8 F (37.1 C)  TempSrc: Oral  SpO2: 97%  Weight: 271 lb 6 oz (123.1 kg)  Height: 5\' 2"  (1.575 m)   Wt Readings from Last 3 Encounters:  12/29/22 271 lb 6 oz (123.1 kg)  12/01/20 262 lb 12.8 oz (119.2 kg)  07/28/20 261 lb (118.4 kg)   Body mass index is 49.64 kg/m.  Physical Exam Vitals and nursing note reviewed. Exam conducted with a chaperone present.  Constitutional:      General: She is not in acute distress.    Appearance: She is well-developed.  HENT:     Head: Normocephalic and atraumatic.     Mouth/Throat:  Mouth: Mucous membranes are moist.     Pharynx: Oropharynx is clear.  Eyes:     Conjunctiva/sclera: Conjunctivae normal.  Cardiovascular:     Rate and Rhythm: Normal rate and regular rhythm.     Pulses:          Dorsalis pedis pulses are 2+ on the right side and 2+ on the left side.     Heart sounds: No murmur heard. Pulmonary:     Effort: Pulmonary effort is normal. No respiratory distress.     Breath sounds: Normal breath sounds.  Abdominal:     Palpations: Abdomen is soft. There is no hepatomegaly or mass.     Tenderness: There is no abdominal tenderness.  Genitourinary:    General: Normal vulva.     Exam position: Lithotomy position.     Labia:        Right: No tenderness or lesion.        Left: No tenderness or lesion.      Vagina: Bleeding present. No erythema.     Comments: Could not do place speculum due to discomfort, so will defer to gyn. Lymphadenopathy:     Cervical: No cervical adenopathy.  Skin:    General: Skin is warm.     Findings: No erythema or rash.  Neurological:     General: No focal  deficit present.     Mental Status: She is alert and oriented to person, place, and time.     Cranial Nerves: No cranial nerve deficit.     Gait: Gait normal.  Psychiatric:        Mood and Affect: Mood is anxious. Affect is tearful.     Comments: Well groomed, good eye contact.   ASSESSMENT AND PLAN:  Christine Barrera was seen today for menstrual problem.  Diagnoses and all orders for this visit:  Lab Results  Component Value Date   TSH 12.32 (H) 12/29/2022   Lab Results  Component Value Date   WBC 7.1 12/29/2022   HGB 9.1 (L) 12/29/2022   HCT 30.7 (L) 12/29/2022   MCV 61.2 Repeated and verified X2. (L) 12/29/2022   PLT 411.0 (H) 12/29/2022   Lab Results  Component Value Date   CREATININE 0.55 12/29/2022   BUN 9 12/29/2022   NA 138 12/29/2022   K 4.0 12/29/2022   CL 103 12/29/2022   CO2 26 12/29/2022   Lab Results  Component Value Date   [id number] 030 (L) 12/29/2022    DUB (dysfunctional uterine bleeding) Assessment & Plan: We discussed possible etiologies. Could not perform pelvic exam due to discomfort when trying to place speculum. She has not had a pap smear ever. Reviewed treatment options and well as side effects. She agrees with ortho cyclen 2 tabs until bleeding stops and continue 1 tab daily until she sees gyn. Instructed about warning signs. Further recommendations according to lab results.  Orders: -     Ambulatory referral to Gynecology -     Norgestimate-Eth Estradiol; Take 1 tablet by mouth daily.  Dispense: 60 tablet; Refill: 0 -     Urine cytology ancillary only -     POCT urine pregnancy  Iron deficiency anemia due to chronic blood loss Assessment & Plan: Due to heavy menses. She is not on iron supplementation. Further recommendations according to CBC and iron studies.  Orders: -     CBC; Future -     Iron -     Ferritin -     Ferrous Sulfate; Take  1 tablet (325 mg total) by mouth daily.  Dispense: 90 tablet; Refill: 2  B12  deficiency Assessment & Plan: She is not on B12 supplementation. Last B12 was in normal range, 414 in 07/2020.  Orders: -     Vitamin B12; Future  Vitamin D deficiency, unspecified Assessment & Plan: She is not on Vit D supplementation. Further recommendations according to 25 OH vit D result.  Orders: -     VITAMIN D 25 Hydroxy (Vit-D Deficiency, Fractures); Future  Obesity, Class III, BMI 40-49.9 (morbid obesity) (HCC) Assessment & Plan: Patient understands the benefits of wt loss as well as adverse effects of obesity. Consistency with healthy diet and physical activity encouraged.  Orders: -     Basic metabolic panel; Future -     Hemoglobin A1c; Future  Primary hypothyroidism Assessment & Plan: She has not been complaint with taking Levothyroxine nor with f/u visits. Last TSH 6.4 in 09/2020. Further recommendations according to TSH result.  Orders: -     T4, free; Future -     TSH; Future  I spent a total of 41 minutes in both face to face and non face to face activities for this visit on the date of this encounter. During this time history was obtained and documented, examination was performed, prior labs reviewed, and assessment/plan discussed.  Leighton Luster G. [place], MD  [facility]. [place] office.

## 2022-12-29 NOTE — Assessment & Plan Note (Signed)
Due to heavy menses. She is not on iron supplementation. Further recommendations according to CBC and iron studies.

## 2022-12-29 NOTE — Assessment & Plan Note (Addendum)
We discussed possible etiologies. Could not perform pelvic exam due to discomfort when trying to place speculum. She has not had a pap smear ever. Reviewed treatment options and well as side effects. She agrees with ortho cyclen 2 tabs until bleeding stops and continue 1 tab daily until she sees gyn. Instructed about warning signs. Further recommendations according to lab results.

## 2022-12-29 NOTE — Assessment & Plan Note (Signed)
She has not been complaint with taking Levothyroxine nor with f/u visits. Last TSH 6.4 in 09/2020. Further recommendations according to TSH result.

## 2022-12-29 NOTE — Assessment & Plan Note (Signed)
Patient understands the benefits of wt loss as well as adverse effects of obesity. Consistency with healthy diet and physical activity encouraged.

## 2022-12-29 NOTE — Progress Notes (Incomplete)
ACUTE VISIT Chief Complaint  Patient presents with  . Menstrual Problem   HPI: [name] is a 37 y.o. female with PMHx significant for hypothyroidism and iron def anemia here today complaining of 3-4 weeks of vaginal bleeding. She was last seen on 12/01/2020. She is c/o irregular menstrual periods. She reports not having her period for three months, followed by spotting in November and a heavy flow for 10 days in December, which is still ongoing. The patient admits to taking her thyroid medication irregularly throughout the last year due to personal and work-related tensions. She denies any cravings for ice or flour but does report feeling tired sometimes.  She does not recall ever having a pap smear or a pelvic examination. The patient has been experiencing feelings of sadness and crying, which she attributes to her menstrual issues. She sleeps an average of seven hours per night.   Vaginal Bleeding The patient's primary symptoms include vaginal bleeding. The patient's pertinent negatives include no genital itching, genital lesions, genital odor, genital rash, missed menses, pelvic pain or vaginal discharge. The current episode started more than 1 month ago. Associated symptoms include back pain. Pertinent negatives include no abdominal pain, chills, constipation, dysuria, fever, flank pain, frequency, headaches, hematuria, nausea, painful intercourse, rash, sore throat, urgency or vomiting.  She is sexually active and not currently using any form of birth control. She denies experiencing any pelvic pain, cramps, nausea, or vomiting, but does report occasional lower back pain.  Hypothyroidism: Synthroid 75 mcg daily.  Lab Results  Component Value Date   TSH 6.46 (H) 09/27/2020   Iron def anemia: She has recently started taking iron tablets for the past five days. Negative for pica. She has not noted blood in stool or melena. Negative severe/frequent headache, visual  changes, chest pain, dyspnea, or palpitation.  Lab Results  Component Value Date   WBC 6.4 09/27/2020   HGB 9.7 (L) 09/27/2020   HCT 33.9 (L) 09/27/2020   MCV 67.7 (L) 09/27/2020   PLT 363 09/27/2020   B12 and vit D def, she is not on B12 or vit D supplementation.  Lab Results  Component Value Date   [id number] 07/28/2020   Review of Systems  Constitutional:  Negative for chills and fever.  HENT:  Negative for sore throat.   Gastrointestinal:  Negative for abdominal pain, constipation, nausea and vomiting.  Genitourinary:  Positive for vaginal bleeding. Negative for dysuria, flank pain, frequency, hematuria, missed menses, pelvic pain, urgency and vaginal discharge.  Musculoskeletal:  Positive for back pain.  Skin:  Negative for rash.  Neurological:  Negative for headaches.  See other pertinent positives and negatives in HPI.  Current Outpatient Medications on File Prior to Visit  Medication Sig Dispense Refill  . ferrous sulfate 325 (65 FE) MG tablet TAKE 1 TABLET BY MOUTH EVERY DAY 90 tablet 1  . SYNTHROID 75 MCG tablet Take 1 tablet (75 mcg total) by mouth daily before breakfast. Please call [phone] to set up an appointment. 90 tablet 0   No current facility-administered medications on file prior to visit.    Past Medical History:  Diagnosis Date  . Anemia   . Thyroid disease    Allergies  Allergen Reactions  . Ibuprofen Swelling    Social History   Socioeconomic History  . Marital status: Married    Spouse name: Not on file  . Number of children: 1  . Years of education: Not on file  . Highest education level: Bachelor's  degree (e.g., BA, AB, BS)  Occupational History  . Not on file  Tobacco Use  . Smoking status: Never  . Smokeless tobacco: Never  Vaping Use  . Vaping Use: Never used  Substance and Sexual Activity  . Alcohol use: No    Alcohol/week: 0.0 standard drinks of alcohol  . Drug use: Never  . Sexual activity: Not Currently     Birth control/protection: Condom  Other Topics Concern  . Not on file  Social History Narrative  . Not on file   Social Determinants of Health   Financial Resource Strain: Low Risk  (12/28/2022)   Overall Financial Resource Strain (CARDIA)   . Difficulty of Paying Living Expenses: Not hard at all  Food Insecurity: No Food Insecurity (12/28/2022)   Hunger Vital Sign   . Worried About Programme researcher, broadcasting/film/video in the Last Year: Never true   . Ran Out of Food in the Last Year: Never true  Transportation Needs: No Transportation Needs (12/28/2022)   PRAPARE - Transportation   . Lack of Transportation (Medical): No   . Lack of Transportation (Non-Medical): No  Physical Activity: Insufficiently Active (12/28/2022)   Exercise Vital Sign   . Days of Exercise per Week: 1 day   . Minutes of Exercise per Session: 40 min  Stress: Stress Concern Present (12/28/2022)   [employer] of Occupational Health - Occupational Stress Questionnaire   . Feeling of Stress : To some extent  Social Connections: Moderately Isolated (12/28/2022)   Social Connection and Isolation Panel [NHANES]   . Frequency of Communication with Friends and Family: More than three times a week   . Frequency of Social Gatherings with Friends and Family: Once a week   . Attends Religious Services: Never   . Active Member of Clubs or Organizations: No   . Attends Banker Meetings: Not on file   . Marital Status: Married    Vitals:   12/29/22 1007  BP: 134/80  Pulse: 100  Temp: 98.8 F (37.1 C)  SpO2: 97%   Wt Readings from Last 3 Encounters:  12/29/22 271 lb 6 oz (123.1 kg)  12/01/20 262 lb 12.8 oz (119.2 kg)  07/28/20 261 lb (118.4 kg)   Body mass index is 49.64 kg/m.  Physical Exam Vitals and nursing note reviewed. Exam conducted with a chaperone present.  Constitutional:      General: She is not in acute distress.    Appearance: She is well-developed.  HENT:     Head: Normocephalic and atraumatic.      Mouth/Throat:     Mouth: Mucous membranes are moist.     Pharynx: Oropharynx is clear.  Eyes:     Conjunctiva/sclera: Conjunctivae normal.  Cardiovascular:     Rate and Rhythm: Normal rate and regular rhythm.     Pulses:          Dorsalis pedis pulses are 2+ on the right side and 2+ on the left side.     Heart sounds: No murmur heard. Pulmonary:     Effort: Pulmonary effort is normal. No respiratory distress.     Breath sounds: Normal breath sounds.  Abdominal:     Palpations: Abdomen is soft. There is no hepatomegaly or mass.     Tenderness: There is no abdominal tenderness.  Genitourinary:    General: Normal vulva.     Exam position: Lithotomy position.     Labia:        Right: No tenderness or lesion.  Left: No tenderness or lesion.      Vagina: Bleeding present. No erythema.     Comments: Could not do place speculum due to discomfort, so will defer to gyn. Lymphadenopathy:     Cervical: No cervical adenopathy.  Skin:    General: Skin is warm.     Findings: No erythema or rash.  Neurological:     General: No focal deficit present.     Mental Status: She is alert and oriented to person, place, and time.     Cranial Nerves: No cranial nerve deficit.     Gait: Gait normal.  Psychiatric:        Mood and Affect: Mood is anxious. Affect is tearful.     Comments: Well groomed, good eye contact.     ASSESSMENT AND PLAN: Sharlee was seen today for menstrual problem.  Diagnoses and all orders for this visit:  Lab Results  Component Value Date   TSH 12.32 (H) 12/29/2022   Lab Results  Component Value Date   WBC 7.1 12/29/2022   HGB 9.1 (L) 12/29/2022   HCT 30.7 (L) 12/29/2022   MCV 61.2 Repeated and verified X2. (L) 12/29/2022   PLT 411.0 (H) 12/29/2022   Lab Results  Component Value Date   CREATININE 0.55 12/29/2022   BUN 9 12/29/2022   NA 138 12/29/2022   K 4.0 12/29/2022   CL 103 12/29/2022   CO2 26 12/29/2022   Lab Results  Component Value Date    VITAMINB12 201 (L) 12/29/2022      Sheehan Stacey G. Martin[place], MD  [facility]. [place] office.

## 2022-12-29 NOTE — Assessment & Plan Note (Signed)
She is not on B12 supplementation. Last B12 was in normal range, 414 in 07/2020.

## 2022-12-29 NOTE — Assessment & Plan Note (Signed)
She is not on Vit D supplementation. Further recommendations according to 25 OH vit D result. 

## 2022-12-29 NOTE — Patient Instructions (Addendum)
A few things to remember from today's visit:  DUB (dysfunctional uterine bleeding) - Plan: Ambulatory referral to Gynecology, norgestimate-ethinyl estradiol (ORTHO-CYCLEN) 0.25-35 MG-MCG tablet  Iron deficiency anemia due to chronic blood loss - Plan: CBC, Iron, Ferritin  B12 deficiency - Plan: Vitamin B12  Vitamin D deficiency, unspecified - Plan: VITAMIN D 25 Hydroxy (Vit-D Deficiency, Fractures)  Obesity, Class III, BMI 40-49.9 (morbid obesity) ([place]) - Plan: Basic metabolic panel, Hemoglobin A1c  Primary hypothyroidism - Plan: T4, free, TSH  Ortho cyclen 2 tabs daily until bleeding resolves then once daily. Appt with gyn is going to be arrange, you need a pap smear and possible ultrasound.  If you need refills for medications you take chronically, please call your pharmacy. Do not use My Chart to request refills or for acute issues that need immediate attention. If you send a my chart message, it may take a few days to be addressed, specially if I am not in the office.  Please be sure medication list is accurate. If a new problem present, please set up appointment sooner than planned today.

## 2022-12-30 MED ORDER — VITAMIN D (ERGOCALCIFEROL) 1.25 MG (50000 UNIT) PO CAPS: 50000.0000 [IU] | ORAL_CAPSULE | ORAL | 0 refills | Status: AC

## 2022-12-30 MED ORDER — FERROUS SULFATE 325 (65 FE) MG PO TABS: 325.0000 mg | ORAL_TABLET | Freq: Every day | ORAL | 2 refills | Status: DC

## 2023-01-01 ENCOUNTER — Encounter: Payer: Self-pay | Admitting: Family Medicine

## 2023-01-01 ENCOUNTER — Other Ambulatory Visit: Payer: Self-pay | Admitting: Family Medicine

## 2023-01-01 DIAGNOSIS — E039 Hypothyroidism, unspecified: Secondary | ICD-10-CM

## 2023-01-01 LAB — URINE CYTOLOGY ANCILLARY ONLY
Chlamydia: NEGATIVE
Comment: NEGATIVE
Comment: NEGATIVE
Comment: NORMAL
Neisseria Gonorrhea: NEGATIVE
Trichomonas: NEGATIVE

## 2023-01-01 MED ORDER — SYNTHROID 75 MCG PO TABS: 75.0000 ug | ORAL_TABLET | Freq: Every day | ORAL | 2 refills | Status: DC

## 2023-01-03 ENCOUNTER — Ambulatory Visit: Payer: 59

## 2023-01-03 ENCOUNTER — Ambulatory Visit: Payer: 59 | Admitting: Physician Assistant

## 2023-01-10 ENCOUNTER — Ambulatory Visit (INDEPENDENT_AMBULATORY_CARE_PROVIDER_SITE_OTHER): Payer: 59 | Admitting: *Deleted

## 2023-01-10 DIAGNOSIS — E538 Deficiency of other specified B group vitamins: Secondary | ICD-10-CM | POA: Diagnosis not present

## 2023-01-10 MED ORDER — CYANOCOBALAMIN 1000 MCG/ML IJ SOLN
1000.0000 ug | Freq: Once | INTRAMUSCULAR | Status: AC
  Administered 2023-01-10: 1000 ug via INTRAMUSCULAR

## 2023-01-10 NOTE — Progress Notes (Signed)
Per orders of Dr. Banks, injection of Cyanocobalamin 1000mcg given by Kenzley Ke A. °Patient tolerated injection well. ° °

## 2023-01-15 ENCOUNTER — Encounter: Payer: 59 | Admitting: Family Medicine

## 2023-01-15 ENCOUNTER — Ambulatory Visit: Payer: 59 | Admitting: Nurse Practitioner

## 2023-01-17 ENCOUNTER — Ambulatory Visit: Payer: 59 | Admitting: Nurse Practitioner

## 2023-01-17 ENCOUNTER — Ambulatory Visit: Payer: 59

## 2023-01-22 ENCOUNTER — Ambulatory Visit (INDEPENDENT_AMBULATORY_CARE_PROVIDER_SITE_OTHER): Payer: 59 | Admitting: Nurse Practitioner

## 2023-01-22 ENCOUNTER — Encounter: Payer: Self-pay | Admitting: Nurse Practitioner

## 2023-01-22 ENCOUNTER — Other Ambulatory Visit (HOSPITAL_COMMUNITY)
Admission: RE | Admit: 2023-01-22 | Discharge: 2023-01-22 | Disposition: A | Discharge status: Home / Self Care | Payer: 59 | Source: Ambulatory Visit | Attending: Nurse Practitioner | Admitting: Nurse Practitioner

## 2023-01-22 VITALS — BP 122/64 | HR 106 | Ht 62.0 in | Wt 271.0 lb

## 2023-01-22 DIAGNOSIS — E039 Hypothyroidism, unspecified: Secondary | ICD-10-CM

## 2023-01-22 DIAGNOSIS — Z124 Encounter for screening for malignant neoplasm of cervix: Secondary | ICD-10-CM | POA: Diagnosis not present

## 2023-01-22 DIAGNOSIS — N939 Abnormal uterine and vaginal bleeding, unspecified: Secondary | ICD-10-CM | POA: Diagnosis not present

## 2023-01-22 NOTE — Progress Notes (Signed)
   Acute Office Visit  Subjective:    Patient ID: Ladonya Barrera, female    DOB: [birth year], 37 y.o.   MRN: [id number]   HPI 37 y.o. G2P1011 presents today for recent episode of abnormal uterine bleeding and pap smear. PCP unable to collect pap due to discomfort of exam. Did not have menses August-October, then began bleeding in November and bled for ~45 days. Bleeding was moderate to heavy at times. Saw PCP 12/29/21 for this and was provided COCs but she has not started them. TSH 12 at that time. She stopped taking thyroid medication for about a year.  Has restarted Synthroid and bleeding stopped after a few days. Has not had pap smear. Also being treated for anemia, low Vit D and low B12.    Review of Systems  Constitutional:  Positive for fatigue.  Genitourinary:  Positive for menstrual problem.       Objective:    Physical Exam Constitutional:      Appearance: Normal appearance. She is obese.  Genitourinary:    General: Normal vulva.     Vagina: Normal.     Cervix: Normal.     Uterus: Normal.      Adnexa: Right adnexa normal and left adnexa normal.     BP 122/64   Pulse (!) 106   Ht 5\' 2"  (1.575 m)   Wt 271 lb (122.9 kg)   LMP 01/05/2023   SpO2 100%   BMI 49.57 kg/m  Wt Readings from Last 3 Encounters:  01/22/23 271 lb (122.9 kg)  12/29/22 271 lb 6 oz (123.1 kg)  12/01/20 262 lb 12.8 oz (119.2 kg)        Patient informed chaperone available to be present for breast and/or pelvic exam. Patient has requested no chaperone to be present. Patient has been advised what will be completed during breast and pelvic exam.   Assessment & Plan:   Problem List Items Addressed This Visit       Endocrine   Primary hypothyroidism - Primary (Chronic)   Other Visit Diagnoses     Abnormal uterine bleeding       Encounter for Papanicolaou smear for cervical cancer screening       Relevant Orders   Cytology - PAP( [place])      Plan: No longer bleeding. Reassured  that bleeding likely related to abnormal thyroid. Does not need to start COCs at this time. She does not want contraception but aware these also help to manage bleeding. Continue iron supplement. Will repeat labs with PCP in March. Pap pending. She will reach out if abnormal bleeding returns. All questions answered.      Tamela Gammon DNP, 2:15 PM 01/22/2023

## 2023-01-24 ENCOUNTER — Ambulatory Visit (INDEPENDENT_AMBULATORY_CARE_PROVIDER_SITE_OTHER): Payer: 59

## 2023-01-24 ENCOUNTER — Ambulatory Visit: Payer: 59

## 2023-01-24 DIAGNOSIS — E538 Deficiency of other specified B group vitamins: Secondary | ICD-10-CM | POA: Diagnosis not present

## 2023-01-24 MED ORDER — CYANOCOBALAMIN 1000 MCG/ML IJ SOLN
1000.0000 ug | Freq: Once | INTRAMUSCULAR | Status: AC
  Administered 2023-01-24: 1000 ug via INTRAMUSCULAR

## 2023-01-24 NOTE — Progress Notes (Signed)
Pt here for monthly B12 injection per Dr. Jordan.  B12 1000mcg given IM, and pt tolerated injection well.  

## 2023-01-25 ENCOUNTER — Encounter: Payer: Self-pay | Admitting: Nurse Practitioner

## 2023-01-29 LAB — CYTOLOGY - PAP
Comment: NEGATIVE
Diagnosis: NEGATIVE
Diagnosis: REACTIVE
High risk HPV: NEGATIVE

## 2023-01-31 ENCOUNTER — Ambulatory Visit (INDEPENDENT_AMBULATORY_CARE_PROVIDER_SITE_OTHER): Payer: 59

## 2023-01-31 DIAGNOSIS — E538 Deficiency of other specified B group vitamins: Secondary | ICD-10-CM | POA: Diagnosis not present

## 2023-01-31 MED ORDER — CYANOCOBALAMIN 1000 MCG/ML IJ SOLN
1000.0000 ug | Freq: Once | INTRAMUSCULAR | Status: AC
  Administered 2023-01-31: 1000 ug via INTRAMUSCULAR

## 2023-01-31 NOTE — Progress Notes (Signed)
Per orders of Dr. Jordan, injection of B12 given by Blayne Garlick E Rogen Porte. Patient tolerated injection well.  

## 2023-02-14 ENCOUNTER — Ambulatory Visit (INDEPENDENT_AMBULATORY_CARE_PROVIDER_SITE_OTHER): Payer: 59

## 2023-02-14 DIAGNOSIS — E538 Deficiency of other specified B group vitamins: Secondary | ICD-10-CM

## 2023-02-14 MED ORDER — CYANOCOBALAMIN 1000 MCG/ML IJ SOLN
1000.0000 ug | Freq: Once | INTRAMUSCULAR | Status: AC
  Administered 2023-02-14: 1000 ug via INTRAMUSCULAR

## 2023-02-14 NOTE — Progress Notes (Signed)
Pt here for monthly B12 injection per Dr. Martinique.  B12 1043mg given IM, and pt tolerated injection well.

## 2023-02-16 ENCOUNTER — Other Ambulatory Visit: Payer: Self-pay | Admitting: Family Medicine

## 2023-02-16 DIAGNOSIS — E559 Vitamin D deficiency, unspecified: Secondary | ICD-10-CM

## 2023-03-14 ENCOUNTER — Ambulatory Visit: Payer: 59

## 2023-03-15 ENCOUNTER — Ambulatory Visit (INDEPENDENT_AMBULATORY_CARE_PROVIDER_SITE_OTHER): Payer: 59 | Admitting: *Deleted

## 2023-03-15 DIAGNOSIS — E538 Deficiency of other specified B group vitamins: Secondary | ICD-10-CM | POA: Diagnosis not present

## 2023-03-15 MED ORDER — CYANOCOBALAMIN 1000 MCG/ML IJ SOLN
1000.0000 ug | Freq: Once | INTRAMUSCULAR | Status: AC
  Administered 2023-03-15: 1000 ug via INTRAMUSCULAR

## 2023-03-15 NOTE — Progress Notes (Signed)
Per orders of Dr. Banks, injection of B12 given by Sherah Lund. Patient tolerated injection well.   

## 2023-04-18 ENCOUNTER — Ambulatory Visit (INDEPENDENT_AMBULATORY_CARE_PROVIDER_SITE_OTHER): Payer: 59

## 2023-04-18 DIAGNOSIS — E538 Deficiency of other specified B group vitamins: Secondary | ICD-10-CM | POA: Diagnosis not present

## 2023-04-18 MED ORDER — CYANOCOBALAMIN 1000 MCG/ML IJ SOLN
1000.0000 ug | Freq: Once | INTRAMUSCULAR | Status: AC
  Administered 2023-04-18: 1000 ug via INTRAMUSCULAR

## 2023-04-18 NOTE — Progress Notes (Signed)
Per orders of Dr. Jordan, injection of Cyanocobalamin 1000 mcg given by Quin Mathenia L Keghan Mcfarren. Patient tolerated injection well.  

## 2023-04-27 NOTE — Progress Notes (Deleted)
   ACUTE VISIT No chief complaint on file.  HPI: [name] is a 37 y.o. female, who is here today complaining of *** HPI  Review of Systems See other pertinent positives and negatives in HPI.  Current Outpatient Medications on File Prior to Visit  Medication Sig Dispense Refill   ferrous sulfate 325 (65 FE) MG tablet Take 1 tablet (325 mg total) by mouth daily. 90 tablet 2   SYNTHROID 75 MCG tablet Take 1 tablet (75 mcg total) by mouth daily before breakfast. 90 tablet 2   No current facility-administered medications on file prior to visit.    Past Medical History:  Diagnosis Date   Anemia    Thyroid disease    Allergies  Allergen Reactions   Ibuprofen Swelling    Social History   Socioeconomic History   Marital status: Married    Spouse name: Not on file   Number of children: 1   Years of education: Not on file   Highest education level: Bachelor's degree (e.g., BA, AB, BS)  Occupational History   Not on file  Tobacco Use   Smoking status: Never   Smokeless tobacco: Never  Vaping Use   Vaping Use: Never used  Substance and Sexual Activity   Alcohol use: No    Alcohol/week: 0.0 standard drinks of alcohol   Drug use: Never   Sexual activity: Yes    Birth control/protection: Pill  Other Topics Concern   Not on file  Social History Narrative   Not on file   Social Determinants of Health   Financial Resource Strain: Low Risk  (12/28/2022)   Overall Financial Resource Strain (CARDIA)    Difficulty of Paying Living Expenses: Not hard at all  Food Insecurity: No Food Insecurity (12/28/2022)   Hunger Vital Sign    Worried About Running Out of Food in the Last Year: Never true    Ran Out of Food in the Last Year: Never true  Transportation Needs: No Transportation Needs (12/28/2022)   PRAPARE - Administrator, Civil Service (Medical): No    Lack of Transportation (Non-Medical): No  Physical Activity: Insufficiently Active (12/28/2022)    Exercise Vital Sign    Days of Exercise per Week: 1 day    Minutes of Exercise per Session: 40 min  Stress: Stress Concern Present (12/28/2022)   [employer] of Occupational Health - Occupational Stress Questionnaire    Feeling of Stress : To some extent  Social Connections: Moderately Isolated (12/28/2022)   Social Connection and Isolation Panel [NHANES]    Frequency of Communication with Friends and Family: More than three times a week    Frequency of Social Gatherings with Friends and Family: Once a week    Attends Religious Services: Never    Database administrator or Organizations: No    Attends Engineer, structural: Not on file    Marital Status: Married    There were no vitals filed for this visit. There is no height or weight on file to calculate BMI.  Physical Exam  ASSESSMENT AND PLAN: There are no diagnoses linked to this encounter.  No follow-ups on file.  Betty G. [place], MD  [facility]. [place] office.  Discharge Instructions   None

## 2023-04-30 ENCOUNTER — Ambulatory Visit: Payer: 59 | Admitting: Family Medicine

## 2023-05-16 ENCOUNTER — Ambulatory Visit (INDEPENDENT_AMBULATORY_CARE_PROVIDER_SITE_OTHER): Payer: 59

## 2023-05-16 DIAGNOSIS — E538 Deficiency of other specified B group vitamins: Secondary | ICD-10-CM | POA: Diagnosis not present

## 2023-05-16 MED ORDER — CYANOCOBALAMIN 1000 MCG/ML IJ SOLN
1000.0000 ug | Freq: Once | INTRAMUSCULAR | Status: AC
  Administered 2023-05-16: 1000 ug via INTRAMUSCULAR

## 2023-05-16 NOTE — Progress Notes (Signed)
Per orders of Dr. Jordan, injection of Cyanocobalamin 1000 mcg given by Kayleana Waites L Jaydrian Corpening. Patient tolerated injection well.  

## 2023-09-03 ENCOUNTER — Encounter: Payer: Self-pay | Admitting: Family Medicine

## 2023-09-17 ENCOUNTER — Ambulatory Visit (INDEPENDENT_AMBULATORY_CARE_PROVIDER_SITE_OTHER): Payer: 59

## 2023-09-17 DIAGNOSIS — E538 Deficiency of other specified B group vitamins: Secondary | ICD-10-CM

## 2023-09-17 MED ORDER — CYANOCOBALAMIN 1000 MCG/ML IJ SOLN
1000.0000 ug | Freq: Once | INTRAMUSCULAR | Status: AC
  Administered 2023-09-17: 1000 ug via INTRAMUSCULAR

## 2023-09-17 NOTE — Progress Notes (Signed)
Per orders of Dr. [place] , injection of B-12 given by Stann Ore. Patient tolerated injection well.

## 2023-10-10 ENCOUNTER — Other Ambulatory Visit: Payer: Self-pay | Admitting: Family Medicine

## 2023-10-10 DIAGNOSIS — D500 Iron deficiency anemia secondary to blood loss (chronic): Secondary | ICD-10-CM

## 2023-10-10 DIAGNOSIS — E039 Hypothyroidism, unspecified: Secondary | ICD-10-CM

## 2023-10-29 ENCOUNTER — Ambulatory Visit (INDEPENDENT_AMBULATORY_CARE_PROVIDER_SITE_OTHER): Payer: 59

## 2023-10-29 DIAGNOSIS — E538 Deficiency of other specified B group vitamins: Secondary | ICD-10-CM | POA: Diagnosis not present

## 2023-10-29 MED ORDER — CYANOCOBALAMIN 1000 MCG/ML IJ SOLN
1000.0000 ug | Freq: Once | INTRAMUSCULAR | Status: AC
  Administered 2023-10-29: 1000 ug via INTRAMUSCULAR

## 2023-10-29 NOTE — Progress Notes (Signed)
Per orders of Dr. Salomon Fick, injection of B12 given by Vickii Chafe. Patient tolerated injection well.

## 2023-12-12 ENCOUNTER — Ambulatory Visit: Payer: 59

## 2023-12-12 DIAGNOSIS — E538 Deficiency of other specified B group vitamins: Secondary | ICD-10-CM | POA: Diagnosis not present

## 2023-12-12 MED ORDER — CYANOCOBALAMIN 1000 MCG/ML IJ SOLN
1000.0000 ug | Freq: Once | INTRAMUSCULAR | Status: AC
  Administered 2023-12-12: 1000 ug via INTRAMUSCULAR

## 2023-12-12 NOTE — Progress Notes (Signed)
Pt here for monthly B12 injection per Dr. Jordan.  B12 1000mcg given IM and pt tolerated injection well.   

## 2023-12-17 ENCOUNTER — Encounter (HOSPITAL_COMMUNITY): Payer: Self-pay

## 2023-12-17 ENCOUNTER — Ambulatory Visit (HOSPITAL_COMMUNITY)
Admission: RE | Admit: 2023-12-17 | Discharge: 2023-12-17 | Disposition: A | Discharge status: Home / Self Care | Payer: 59 | Source: Ambulatory Visit | Attending: Physician Assistant | Admitting: Physician Assistant

## 2023-12-17 VITALS — BP 104/71 | HR 86 | Temp 99.1°F | Resp 18 | Ht 62.0 in | Wt 269.0 lb

## 2023-12-17 DIAGNOSIS — R051 Acute cough: Secondary | ICD-10-CM | POA: Diagnosis present

## 2023-12-17 DIAGNOSIS — U071 COVID-19: Secondary | ICD-10-CM | POA: Insufficient documentation

## 2023-12-17 LAB — BASIC METABOLIC PANEL
Anion gap: 7 (ref 5–15)
BUN: 5 mg/dL — ABNORMAL LOW (ref 6–20)
CO2: 24 mmol/L (ref 22–32)
Calcium: 8.7 mg/dL — ABNORMAL LOW (ref 8.9–10.3)
Chloride: 108 mmol/L (ref 98–111)
Creatinine, Ser: 0.61 mg/dL (ref 0.44–1.00)
GFR, Estimated: >60 mL/min (ref >60)
Glucose, Bld: 67 mg/dL — ABNORMAL LOW (ref 70–99)
Potassium: 4 mmol/L (ref 3.5–5.1)
Sodium: 139 mmol/L (ref 135–145)

## 2023-12-17 LAB — POC COVID19/FLU A&B COMBO
Covid Antigen, POC: POSITIVE — AB
Influenza A Antigen, POC: NEGATIVE
Influenza B Antigen, POC: NEGATIVE

## 2023-12-17 MED ORDER — PROMETHAZINE-DM 6.25-15 MG/5ML PO SYRP: 5.0000 mL | ORAL_SOLUTION | Freq: Two times a day (BID) | ORAL | 0 refills | Status: DC | PRN

## 2023-12-17 MED ORDER — PAXLOVID (300/100) 20 X 150 MG & 10 X 100MG PO TBPK: 3.0000 | ORAL_TABLET | Freq: Two times a day (BID) | ORAL | 0 refills | Status: AC

## 2023-12-17 NOTE — ED Triage Notes (Addendum)
Fever, sore throat, headache, slight cough, runny nose onset 2 days first starting with body aches. Patient's husband was sick first and is positive for COVID.   Patient tried tylenol and theraflu with little relief.

## 2023-12-17 NOTE — ED Provider Notes (Signed)
[facility]    CSN: [id number] Arrival date & time: 12/17/23  1315      History   Chief Complaint Chief Complaint  Patient presents with   Covid Exposure   Cough   Appointment    HPI Christine Barrera is a 37 y.o. female.   Patient presents today with a 2-day history of URI symptoms including fever, chills, sore throat, headache, cough, rhinorrhea, body aches.  Denies any chest pain, shortness of breath, nausea, vomiting, diarrhea.  She has tried Tylenol and TheraFlu without improvement of symptoms.  She has not had COVID in the past.  Her husband recently tested positive for COVID.  She did have initial vaccines but has not had most recent booster.  She denies any recent antibiotics or steroids.  She denies history of allergies, asthma, COPD, smoking.  Denies any history of diabetes, malignancy, chronic liver/kidney disease, cardiovascular disease.  She has no concern for pregnancy.    Past Medical History:  Diagnosis Date   Anemia    Thyroid disease     Patient Active Problem List   Diagnosis Date Noted   Obesity, Class III, BMI 40-49.9 (morbid obesity) (HCC) 12/29/2022   DUB (dysfunctional uterine bleeding) 12/29/2022   Iron deficiency anemia 03/17/2018   B12 deficiency 03/17/2018   Vitamin D deficiency, unspecified 03/17/2018   Primary hypothyroidism 06/28/2016    Past Surgical History:  Procedure Laterality Date   CESAREAN SECTION      OB History     Gravida  2   Para  1   Term  1   Preterm      AB  1   Living  1      SAB      IAB  1   Ectopic      Multiple      Live Births  1            Home Medications    Prior to Admission medications   Medication Sig Start Date End Date Taking? Authorizing Provider  ferrous sulfate 325 (65 FE) MG tablet TAKE 1 TABLET BY MOUTH EVERY DAY 10/10/23  Yes [place], Betty G, MD  nirmatrelvir/ritonavir (PAXLOVID, 300/100,) 20 x 150 MG & 10 x 100MG  TBPK Take 3 tablets by mouth 2 (two)  times daily for 5 days. Patient GFR is 116.22. Take nirmatrelvir (150 mg) two tablets twice daily for 5 days and ritonavir (100 mg) one tablet twice daily for 5 days. 12/17/23 12/22/23 Yes Julien Oscar K, PA-C  promethazine-dextromethorphan (PROMETHAZINE-DM) 6.25-15 MG/5ML syrup Take 5 mLs by mouth 2 (two) times daily as needed for cough. 12/17/23  Yes Sharis Keeran K, PA-C  SYNTHROID 75 MCG tablet TAKE 1 TABLET BY MOUTH DAILY BEFORE BREAKFAST. 10/10/23  Yes [place], Betty G, MD    Family History Family History  Problem Relation Age of Onset   Breast cancer Neg Hx    Ovarian cancer Neg Hx     Social History Social History   Tobacco Use   Smoking status: Never   Smokeless tobacco: Never  Vaping Use   Vaping status: Never Used  Substance Use Topics   Alcohol use: No    Alcohol/week: 0.0 standard drinks of alcohol   Drug use: Never     Allergies   Ibuprofen   Review of Systems Review of Systems  Constitutional:  Positive for activity change, chills and fever. Negative for appetite change and fatigue.  HENT:  Positive for congestion and sore throat. Negative for  sinus pressure and sneezing.   Respiratory:  Positive for cough. Negative for shortness of breath.   Cardiovascular:  Negative for chest pain.  Gastrointestinal:  Negative for abdominal pain, diarrhea, nausea and vomiting.  Musculoskeletal:  Positive for arthralgias and myalgias.  Neurological:  Positive for headaches. Negative for dizziness and light-headedness.     Physical Exam Triage Vital Signs ED Triage Vitals  Encounter Vitals Group     BP 12/17/23 1413 104/71     Systolic BP Percentile --      Diastolic BP Percentile --      Pulse Rate 12/17/23 1413 86     Resp 12/17/23 1413 18     Temp 12/17/23 1413 99.1 F (37.3 C)     Temp Source 12/17/23 1413 Oral     SpO2 12/17/23 1413 98 %     Weight 12/17/23 1413 268 lb 15.4 oz (122 kg)     Height 12/17/23 1413 5\' 2"  (1.575 m)     Head Circumference --       Peak Flow --      Pain Score 12/17/23 1411 4     Pain Loc --      Pain Education --      Exclude from Growth Chart --    No data found.  Updated Vital Signs BP 104/71 (BP Location: Left Arm)   Pulse 86   Temp 99.1 F (37.3 C) (Oral)   Resp 18   Ht 5\' 2"  (1.575 m)   Wt 268 lb 15.4 oz (122 kg)   LMP 11/17/2023 (Approximate)   SpO2 98%   BMI 49.19 kg/m   Visual Acuity Right Eye Distance:   Left Eye Distance:   Bilateral Distance:    Right Eye Near:   Left Eye Near:    Bilateral Near:     Physical Exam Vitals reviewed.  Constitutional:      General: She is awake. She is not in acute distress.    Appearance: Normal appearance. She is well-developed. She is not ill-appearing.     Comments: Very pleasant female appears stated age in no acute distress sitting comfortably in exam room  HENT:     Head: Normocephalic and atraumatic.     Right Ear: Tympanic membrane, ear canal and external ear normal. Tympanic membrane is not erythematous or bulging.     Left Ear: Tympanic membrane, ear canal and external ear normal. Tympanic membrane is not erythematous or bulging.     Nose:     Right Sinus: Maxillary sinus tenderness present. No frontal sinus tenderness.     Left Sinus: Maxillary sinus tenderness present. No frontal sinus tenderness.     Mouth/Throat:     Pharynx: Uvula midline. Posterior oropharyngeal erythema and postnasal drip present. No oropharyngeal exudate.  Cardiovascular:     Rate and Rhythm: Normal rate and regular rhythm.     Heart sounds: Normal heart sounds, S1 normal and S2 normal. No murmur heard. Pulmonary:     Effort: Pulmonary effort is normal.     Breath sounds: Normal breath sounds. No wheezing, rhonchi or rales.     Comments: Clear to auscultation bilaterally Psychiatric:        Behavior: Behavior is cooperative.      UC Treatments / Results  Labs (all labs ordered are listed, but only abnormal results are displayed) Labs Reviewed  POC  COVID19/FLU A&B COMBO - Abnormal; Notable for the following components:      Result Value   Covid Antigen,  POC Positive (*)    All other components within normal limits  BASIC METABOLIC PANEL    EKG   Radiology No results found.  Procedures Procedures (including critical care time)  Medications Ordered in UC Medications - No data to display  Initial Impression / Assessment and Plan / UC Course  I have reviewed the triage vital signs and the nursing notes.  Pertinent labs & imaging results that were available during my care of the patient were reviewed by me and considered in my medical decision making (see chart for details).     Patient is well-appearing, afebrile, nontoxic, nontachycardic.  No evidence of acute infection on physical exam that would warrant initiation of antibiotics.  COVID testing was positive and she was negative for influenza.  Given obesity she is a candidate for antiviral therapy and after risk/benefits of this medication patient was interested in initiating it.  Her last metabolic panel was from 12/29/2022 with creatinine of 0.55 and estimated GFR of 116 mL/min.  Given this is almost 70-year-old will obtain a BMP to monitor her kidney function but she was provided a prescription for Paxlovid without renal dosing.  We will contact her if we need to adjust her dose of Paxlovid based on her metabolic panel.  She does not require any medication adjustment.  She was encouraged use over-the-counter medication for additional symptom relief.  She was given Promethazine DM for cough.  Discussed that this is sedating and she should not drive or drink alcohol with taking it.  She is to rest and drink plenty of fluid.  If her symptoms are not improving within a week she is to return for reevaluation.  If she has any worsening symptoms she needs to be seen immediately.  Strict return precautions given.  Work excuse note provided.  Final Clinical Impressions(s) / UC Diagnoses    Final diagnoses:  COVID-19  Acute cough     Discharge Instructions      You tested positive for COVID-19.  Start Paxlovid twice daily for 5 days.  I will contact you if we need to adjust your dose.  Start Promethazine DM for cough.  This will make you sleepy so do not drive or drink alcohol while taking it.  Make sure that you rest and drink plenty of fluid.  You can use over-the-counter medications including Mucinex, Flonase, Tylenol.  You can also use nasal saline and sinus rinses.  Obtain a pulse oximeter from the pharmacy.  Monitor your oxygen saturation and if this drops below 93% return here or see your primary care and if it drops below 90% go to the emergency room.  If your symptoms are not improving within a week or if anything worsens and you have high fever, worsening cough, shortness of breath, nausea, vomiting, chest pain you should be seen immediately.     ED Prescriptions     Medication Sig Dispense Auth. Provider   nirmatrelvir/ritonavir (PAXLOVID, 300/100,) 20 x 150 MG & 10 x 100MG  TBPK Take 3 tablets by mouth 2 (two) times daily for 5 days. Patient GFR is 116.22. Take nirmatrelvir (150 mg) two tablets twice daily for 5 days and ritonavir (100 mg) one tablet twice daily for 5 days. 30 tablet Zamari Vea K, PA-C   promethazine-dextromethorphan (PROMETHAZINE-DM) 6.25-15 MG/5ML syrup Take 5 mLs by mouth 2 (two) times daily as needed for cough. 118 mL Ariele Vidrio K, PA-C      PDMP not reviewed this encounter.   Donata Reddick, Noberto Retort,  PA-C 12/17/23 1510

## 2023-12-17 NOTE — Discharge Instructions (Signed)
You tested positive for COVID-19.  Start Paxlovid twice daily for 5 days.  I will contact you if we need to adjust your dose.  Start Promethazine DM for cough.  This will make you sleepy so do not drive or drink alcohol while taking it.  Make sure that you rest and drink plenty of fluid.  You can use over-the-counter medications including Mucinex, Flonase, Tylenol.  You can also use nasal saline and sinus rinses.  Obtain a pulse oximeter from the pharmacy.  Monitor your oxygen saturation and if this drops below 93% return here or see your primary care and if it drops below 90% go to the emergency room.  If your symptoms are not improving within a week or if anything worsens and you have high fever, worsening cough, shortness of breath, nausea, vomiting, chest pain you should be seen immediately.

## 2023-12-17 NOTE — ED Notes (Addendum)
This RN entered vaginal bleeding as chief complaint by MISTAKE! Ari CMA to triage pt shortly.

## 2024-01-30 ENCOUNTER — Ambulatory Visit: Payer: 59

## 2024-02-18 ENCOUNTER — Ambulatory Visit: Payer: 59

## 2024-02-18 ENCOUNTER — Ambulatory Visit (INDEPENDENT_AMBULATORY_CARE_PROVIDER_SITE_OTHER): Payer: 59 | Admitting: Family Medicine

## 2024-02-18 VITALS — BP 128/80 | HR 100 | Resp 12 | Ht 62.0 in | Wt 273.1 lb

## 2024-02-18 DIAGNOSIS — E538 Deficiency of other specified B group vitamins: Secondary | ICD-10-CM | POA: Diagnosis not present

## 2024-02-18 DIAGNOSIS — E039 Hypothyroidism, unspecified: Secondary | ICD-10-CM | POA: Diagnosis not present

## 2024-02-18 DIAGNOSIS — E559 Vitamin D deficiency, unspecified: Secondary | ICD-10-CM | POA: Diagnosis not present

## 2024-02-18 DIAGNOSIS — M79601 Pain in right arm: Secondary | ICD-10-CM | POA: Diagnosis not present

## 2024-02-18 DIAGNOSIS — Z13220 Encounter for screening for lipoid disorders: Secondary | ICD-10-CM | POA: Diagnosis not present

## 2024-02-18 DIAGNOSIS — D500 Iron deficiency anemia secondary to blood loss (chronic): Secondary | ICD-10-CM

## 2024-02-18 DIAGNOSIS — R7303 Prediabetes: Secondary | ICD-10-CM | POA: Insufficient documentation

## 2024-02-18 DIAGNOSIS — M25521 Pain in right elbow: Secondary | ICD-10-CM

## 2024-02-18 LAB — CBC WITH DIFFERENTIAL/PLATELET
Basophils Absolute: 0 10*3/uL (ref 0.0–0.1)
Basophils Relative: 0.4 % (ref 0.0–3.0)
Eosinophils Absolute: 0.1 10*3/uL (ref 0.0–0.7)
Eosinophils Relative: 2.1 % (ref 0.0–5.0)
HCT: 28.4 % — ABNORMAL LOW (ref 36.0–46.0)
Hemoglobin: 8.4 g/dL — ABNORMAL LOW (ref 12.0–15.0)
Lymphocytes Relative: 32.9 % (ref 12.0–46.0)
Lymphs Abs: 2.3 10*3/uL (ref 0.7–4.0)
MCHC: 29.6 g/dL — ABNORMAL LOW (ref 30.0–36.0)
MCV: 59.7 fl — ABNORMAL LOW (ref 78.0–100.0)
Monocytes Absolute: 0.4 10*3/uL (ref 0.1–1.0)
Monocytes Relative: 5.9 % (ref 3.0–12.0)
Neutro Abs: 4 10*3/uL (ref 1.4–7.7)
Neutrophils Relative %: 58.7 % (ref 43.0–77.0)
Platelets: 437 10*3/uL — ABNORMAL HIGH (ref 150.0–400.0)
RBC: 4.76 Mil/uL (ref 3.87–5.11)
RDW: 21.2 % — ABNORMAL HIGH (ref 11.5–15.5)
WBC: 6.8 10*3/uL (ref 4.0–10.5)

## 2024-02-18 LAB — COMPREHENSIVE METABOLIC PANEL
ALT: 13 U/L (ref 0–35)
AST: 17 U/L (ref 0–37)
Albumin: 4 g/dL (ref 3.5–5.2)
Alkaline Phosphatase: 65 U/L (ref 39–117)
BUN: 12 mg/dL (ref 6–23)
CO2: 25 meq/L (ref 19–32)
Calcium: 9.1 mg/dL (ref 8.4–10.5)
Chloride: 104 meq/L (ref 96–112)
Creatinine, Ser: 0.64 mg/dL (ref 0.40–1.20)
GFR: 111.16 mL/min (ref >60.00)
Glucose, Bld: 94 mg/dL (ref 70–99)
Potassium: 4 meq/L (ref 3.5–5.1)
Sodium: 138 meq/L (ref 135–145)
Total Bilirubin: 0.5 mg/dL (ref 0.2–1.2)
Total Protein: 8.3 g/dL (ref 6.0–8.3)

## 2024-02-18 LAB — T4, FREE: Free T4: 0.74 ng/dL (ref 0.60–1.60)

## 2024-02-18 LAB — HEMOGLOBIN A1C: Hgb A1c MFr Bld: 5.9 % (ref 4.6–6.5)

## 2024-02-18 LAB — LIPID PANEL
Cholesterol: 118 mg/dL (ref 0–200)
HDL: 42.3 mg/dL (ref >39.00)
LDL Cholesterol: 64 mg/dL (ref 0–99)
NonHDL: 75.89
Total CHOL/HDL Ratio: 3
Triglycerides: 61 mg/dL (ref 0.0–149.0)
VLDL: 12.2 mg/dL (ref 0.0–40.0)

## 2024-02-18 LAB — VITAMIN B12: Vitamin B-12: >1537 pg/mL — ABNORMAL HIGH (ref 211–911)

## 2024-02-18 LAB — VITAMIN D 25 HYDROXY (VIT D DEFICIENCY, FRACTURES): VITD: 20.82 ng/mL — ABNORMAL LOW (ref 30.00–100.00)

## 2024-02-18 LAB — TSH: TSH: 13.51 u[IU]/mL — ABNORMAL HIGH (ref 0.35–5.50)

## 2024-02-18 MED ORDER — CYANOCOBALAMIN 1000 MCG/ML IJ SOLN
1000.0000 ug | Freq: Once | INTRAMUSCULAR | Status: AC
  Administered 2024-02-18: 1000 ug via INTRAMUSCULAR

## 2024-02-18 NOTE — Progress Notes (Signed)
 ACUTE VISIT Chief Complaint  Patient presents with   Hand Pain    Right hand x a few months   HPI: [name] is a 38 y.o. female with a PMHx significant for hypothyroidism, vitamin D deficiency, B12 deficiency, and iron deficiency anemia, who is here today complaining of right arm pain.   Right arm pain:  Patient complains of intermittent right arm pain for a few months. The pain is occasionally in the entire arm, but sometimes localized to the upper arm or forearm or a more specific area.  She endorses loss of strength in the arm has had a burning sensation in her arm 1 or 2 times.  Overall, the problem is improving, but is still present.  She mentions she had a fall and hit her elbow in November or December. She developed some elbow pain in January that improved some with vitamin D tablets. Her arm pain had been going on before this.  Pertinent negatives include numbness, tingling, redness, swelling, or neck pain.   Hypothyroidism:  Currently on synthroid 75 mcg daily. She occasionally misses a couple days in a week.  Not taking biotin.  She is not seeing an endocrinologist.  Lab Results  Component Value Date   TSH 12.32 (H) 12/29/2022   Prediabetes:  Exercise: none Diet: no changes Lab Results  Component Value Date   HGBA1C 5.9 12/29/2022   LMP: finished 2 days ago.  She takes iron occasionally.  She says she is no longer having menstrual problems and is no longer following with gynecology.  Lab Results  Component Value Date   WBC 7.1 12/29/2022   HGB 9.1 (L) 12/29/2022   HCT 30.7 (L) 12/29/2022   MCV 61.2 Repeated and verified X2. (L) 12/29/2022   PLT 411.0 (H) 12/29/2022    She is due for a B12 shot today.  Lab Results  Component Value Date   VITAMINB12 201 (L) 12/29/2022   Last vitamin D Lab Results  Component Value Date   VD25OH 14.49 (L) 12/29/2022    Review of Systems See other pertinent positives and negatives in HPI.  Current  Outpatient Medications on File Prior to Visit  Medication Sig Dispense Refill   ferrous sulfate 325 (65 FE) MG tablet TAKE 1 TABLET BY MOUTH EVERY DAY 90 tablet 0   SYNTHROID 75 MCG tablet TAKE 1 TABLET BY MOUTH DAILY BEFORE BREAKFAST. 90 tablet 0   No current facility-administered medications on file prior to visit.    Past Medical History:  Diagnosis Date   Anemia    Thyroid disease    Allergies  Allergen Reactions   Ibuprofen Swelling    Social History   Socioeconomic History   Marital status: Married    Spouse name: Not on file   Number of children: 1   Years of education: Not on file   Highest education level: Bachelor's degree (e.g., BA, AB, BS)  Occupational History   Not on file  Tobacco Use   Smoking status: Never   Smokeless tobacco: Never  Vaping Use   Vaping status: Never Used  Substance and Sexual Activity   Alcohol use: No    Alcohol/week: 0.0 standard drinks of alcohol   Drug use: Never   Sexual activity: Yes    Birth control/protection: Pill  Other Topics Concern   Not on file  Social History Narrative   Not on file   Social Drivers of Health   Financial Resource Strain: Low Risk  (02/18/2024)   Overall  Financial Resource Strain (CARDIA)    Difficulty of Paying Living Expenses: Not hard at all  Food Insecurity: No Food Insecurity (02/18/2024)   Hunger Vital Sign    Worried About Running Out of Food in the Last Year: Never true    Ran Out of Food in the Last Year: Never true  Transportation Needs: No Transportation Needs (02/18/2024)   PRAPARE - Administrator, Civil Service (Medical): No    Lack of Transportation (Non-Medical): No  Physical Activity: Insufficiently Active (02/18/2024)   Exercise Vital Sign    Days of Exercise per Week: 2 days    Minutes of Exercise per Session: 20 min  Stress: Stress Concern Present (02/18/2024)   [employer] of Occupational Health - Occupational Stress Questionnaire    Feeling of Stress : To  some extent  Social Connections: Unknown (02/18/2024)   Social Connection and Isolation Panel [NHANES]    Frequency of Communication with Friends and Family: More than three times a week    Frequency of Social Gatherings with Friends and Family: Never    Attends Religious Services: Patient declined    Database administrator or Organizations: No    Attends Banker Meetings: Not on file    Marital Status: Married    Vitals:   02/18/24 0927  BP: 128/80  Pulse: 100  Resp: 12  SpO2: 100%   Body mass index is 49.96 kg/m.  Physical Exam Vitals and nursing note reviewed.  Constitutional:      General: She is not in acute distress.    Appearance: She is well-developed.  HENT:     Head: Normocephalic and atraumatic.     Mouth/Throat:     Mouth: Mucous membranes are moist.     Pharynx: Oropharynx is clear.  Eyes:     Conjunctiva/sclera: Conjunctivae normal.  Cardiovascular:     Rate and Rhythm: Normal rate and regular rhythm.     Pulses:          Dorsalis pedis pulses are 2+ on the right side and 2+ on the left side.     Heart sounds: No murmur heard. Pulmonary:     Effort: Pulmonary effort is normal. No respiratory distress.     Breath sounds: Normal breath sounds.  Abdominal:     Palpations: Abdomen is soft. There is no hepatomegaly or mass.     Tenderness: There is no abdominal tenderness.  Musculoskeletal:     Right elbow: No tenderness.     Cervical back: No pain with movement. Normal range of motion.     Comments: No neck pain ***  Lymphadenopathy:     Cervical: No cervical adenopathy.  Skin:    General: Skin is warm.     Findings: No erythema or rash.  Neurological:     General: No focal deficit present.     Mental Status: She is alert and oriented to person, place, and time.     Cranial Nerves: No cranial nerve deficit.     Gait: Gait normal.     Deep Tendon Reflexes:     Reflex Scores:      Bicep reflexes are 2+ on the right side and 2+ on the  left side.      Patellar reflexes are 2+ on the right side and 2+ on the left side. Psychiatric:     Comments: Well groomed, good eye contact.    ASSESSMENT AND PLAN:  Ms. Hand was seen today for right hand  pain.   Primary hypothyroidism  Vitamin D deficiency, unspecified  B12 deficiency  Iron deficiency anemia due to chronic blood loss   No follow-ups on file.  I, Rolla Etienne Wierda, acting as a scribe for Afsana Liera [place], MD., have documented all relevant documentation on the behalf of Caeson Filippi [place], MD, as directed by  Rosali Augello [place], MD while in the presence of Addison Freimuth [place], MD.   I, Maecy Podgurski [place], MD, have reviewed all documentation for this visit. The documentation on 02/18/24 for the exam, diagnosis, procedures, and orders are all accurate and complete.  Mckade Gurka G. [place], MD  [facility]. [place] office.  Discharge Instructions   None

## 2024-02-18 NOTE — Patient Instructions (Addendum)
 A few things to remember from today's visit:  Pain of right upper extremity - Plan: DG Cervical Spine Complete  Primary hypothyroidism - Plan: T4, free, TSH, Lipid panel  Vitamin D deficiency, unspecified - Plan: VITAMIN D 25 Hydroxy (Vit-D Deficiency, Fractures), Comprehensive metabolic panel  B12 deficiency - Plan: Vitamin B12  Iron deficiency anemia due to chronic blood loss - Plan: Comprehensive metabolic panel  Prediabetes - Plan: Hemoglobin A1c, Comprehensive metabolic panel  Screening for lipid disorders - Plan: CBC with Differential/Platelet  Right elbow pain  Since arm pain has improved, we can hold on MRI. Monitor for new symptoms. Try to walk 15 min daily. Decrease sugar added foods.  Do not use My Chart to request refills or for acute issues that need immediate attention. If you send a my chart message, it may take a few days to be addressed, specially if I am not in the office.  Please be sure medication list is accurate. If a new problem present, please set up appointment sooner than planned today.

## 2024-02-20 ENCOUNTER — Encounter: Payer: Self-pay | Admitting: Family Medicine

## 2024-02-21 MED ORDER — VITAMIN D (ERGOCALCIFEROL) 1.25 MG (50000 UNIT) PO CAPS: 50000.0000 [IU] | ORAL_CAPSULE | ORAL | 0 refills | Status: AC

## 2024-02-21 NOTE — Assessment & Plan Note (Signed)
 Last TSH in 12/2022 was not at goal, 12.3. Continue Synthroid 75 mcg daily. Further recommendations according to TSH result.

## 2024-02-21 NOTE — Assessment & Plan Note (Signed)
 Encouraged a healthy life style for diabetes prevention. Last HgA1C was 5.9 in 12/2022.

## 2024-02-21 NOTE — Assessment & Plan Note (Signed)
 She is on B12 1000 mcg IM q 4-6 months. Last B12 was 201 in 12/2022. After verbal consent she received B12 1000 Mcg IM x 1 today.

## 2024-02-21 NOTE — Assessment & Plan Note (Signed)
 She has not been taking iron supplementation daily. Has not seen gynecologist. Reports that her menses are not as heavy as they were before. Further recommendations according to CBC results.

## 2024-02-21 NOTE — Assessment & Plan Note (Addendum)
 Has not been consistent with taking vit D, has resumed it a few weeks ago. Last 25 OH vit D was 14.4.

## 2024-02-22 ENCOUNTER — Other Ambulatory Visit: Payer: Self-pay

## 2024-02-22 MED ORDER — SYNTHROID 88 MCG PO TABS
88.0000 ug | ORAL_TABLET | Freq: Every day | ORAL | 0 refills | Status: DC
Start: 2024-02-22 — End: 2024-08-11

## 2024-02-22 NOTE — Telephone Encounter (Signed)
 See result note.

## 2024-03-04 ENCOUNTER — Encounter: Payer: Self-pay | Admitting: Family Medicine

## 2024-04-04 ENCOUNTER — Other Ambulatory Visit: Payer: Self-pay | Admitting: Family Medicine

## 2024-04-04 DIAGNOSIS — D500 Iron deficiency anemia secondary to blood loss (chronic): Secondary | ICD-10-CM

## 2024-07-08 ENCOUNTER — Other Ambulatory Visit: Payer: Self-pay | Admitting: Family Medicine

## 2024-07-08 DIAGNOSIS — D500 Iron deficiency anemia secondary to blood loss (chronic): Secondary | ICD-10-CM

## 2024-07-15 ENCOUNTER — Encounter: Payer: Self-pay | Admitting: Family Medicine

## 2024-07-15 NOTE — Telephone Encounter (Signed)
 Looks like we stopped B12 injections after her blood work in February.

## 2024-07-18 ENCOUNTER — Other Ambulatory Visit: Payer: Self-pay | Admitting: Family Medicine

## 2024-07-18 DIAGNOSIS — E538 Deficiency of other specified B group vitamins: Secondary | ICD-10-CM

## 2024-08-04 ENCOUNTER — Telehealth: Admitting: Physician Assistant

## 2024-08-04 DIAGNOSIS — M549 Dorsalgia, unspecified: Secondary | ICD-10-CM

## 2024-08-04 MED ORDER — METHOCARBAMOL 750 MG PO TABS: 750.0000 mg | ORAL_TABLET | Freq: Every evening | ORAL | 0 refills | Status: AC | PRN

## 2024-08-04 MED ORDER — LIDOCAINE 4 % EX PTCH: 1.0000 | MEDICATED_PATCH | CUTANEOUS | 0 refills | Status: AC

## 2024-08-04 NOTE — Patient Instructions (Signed)
  [name], thank you for joining Lynden GORMAN Snuffer, PA-C for today's virtual visit.  While this provider is not your primary care provider (PCP), if your PCP is located in our provider database this encounter information will be shared with them immediately following your visit.   A [facility] MyChart account gives you access to today's visit and all your visits, tests, and labs performed at [facility]  click here if you don't have a [facility] MyChart account or go to [email or web address]  Consent: (Patient) Christine Barrera provided verbal consent for this virtual visit at the beginning of the encounter.  Current Medications:  Current Outpatient Medications:    ferrous sulfate  325 (65 FE) MG tablet, TAKE 1 TABLET BY MOUTH EVERY DAY, Disp: 90 tablet, Rfl: 1   SYNTHROID  88 MCG tablet, Take 1 tablet (88 mcg total) by mouth daily before breakfast., Disp: 90 tablet, Rfl: 0   Medications ordered in this encounter:  No orders of the defined types were placed in this encounter.    *If you need refills on other medications prior to your next appointment, please contact your pharmacy*  Follow-Up: Call back or seek an in-person evaluation if the symptoms worsen or if the condition fails to improve as anticipated.  [facility] [phone]  Other Instructions Take 650 mg tylenol every 6 hours for pain   Use the salon pas patches as directed   You were given a prescription for Robaxin  which is a muscle relaxer.  You should not drive, work, or operate machinery while taking this medication as it can make you very drowsy.    Follow up with your regular doctor in 1 week for reassessment and seek care sooner if your symptoms worsen or fail to improve.    If you have been instructed to have an in-person evaluation today at a local Urgent Care facility, please use the link below. It will take you to a list of all of our available [facility], including address, phone number and hours of operation. Please do not delay care.  [facility]  If you or a family member do not have a primary care provider, use the link below to schedule a visit and establish care. When you choose a [facility] primary care physician or advanced practice provider, you gain a long-term partner in health. Find a Primary Care Provider  Learn more about [facility] in-office and virtual care options: [facility] - Get Care Now

## 2024-08-04 NOTE — Progress Notes (Signed)
 [name], Christine Barrera are scheduled for a virtual visit with your provider today.    Just as we do with appointments in the office, we must obtain your consent to participate.  Your consent will be active for this visit and any virtual visit you may have with one of our providers in the next 365 days.    If you have a MyChart account, I can also send a copy of this consent to you electronically.  All virtual visits are billed to your insurance company just like a traditional visit in the office.  As this is a virtual visit, video technology does not allow for your provider to perform a traditional examination.  This may limit your provider's ability to fully assess your condition.  If your provider identifies any concerns that need to be evaluated in person or the need to arrange testing such as labs, EKG, etc, we will make arrangements to do so.    Although advances in technology are sophisticated, we cannot ensure that it will always work on either your end or our end.  If the connection with a video visit is poor, we may have to switch to a telephone visit.  With either a video or telephone visit, we are not always able to ensure that we have a secure connection.   I need to obtain your verbal consent now.   Are you willing to proceed with your visit today?   Christine Barrera has provided verbal consent on 08/04/2024 for a virtual visit (video or telephone).   Lynden GORMAN Snuffer, [place] 08/04/2024  6:09 PM   Date:  08/04/2024   ID:  Christine Barrera, DOB 1986-11-13, MRN [id number]  Patient Location: Home Provider Location: Home Office   Participants: Patient and Provider for Visit and Wrap up  Method of visit: Video  Location of Patient: Home Location of Provider: Home Office Consent was obtain for visit over the video. Services rendered by provider: Visit was performed via video  A video enabled telemedicine application was used and I verified that I am speaking with the correct person  using two identifiers.  PCP:  [place], Betty G, MD   Chief Complaint:  back pain  History of Present Illness:    Christine Barrera is a 38 y.o. female with history as stated below. Presents video telehealth for an acute care visit  Pt reports back pain that started 4 days ago. She describes the pain as a catch that is located to the left side of her back. The pain is worse with movement and certain positions. Denies abd pain, nvd, constipation, dysuria, frequency, hematuria, shortness of breath or chest pain. She has tried warm and cold compresses with some mild relief.  Denies leg pain/calf pain, numbness, weakness, incontinence, fever.  Denies any recent heavy lifting, falls or trauma.  Denies h/o ivdu or ca. Denies a chance of pregnancy.   Past Medical, Surgical, Social History, Allergies, and Medications have been Reviewed.  Past Medical History:  Diagnosis Date   Anemia    Thyroid  disease     No outpatient medications have been marked as taking for the 08/04/24 encounter (Video Visit) with [facility] PROVIDER.     Allergies:   Ibuprofen   ROS See HPI for history of present illness.  Physical Exam Constitutional:      Appearance: Normal appearance. She is not ill-appearing.  Pulmonary:     Effort: Pulmonary effort is normal.  Neurological:     Mental Status: She is alert.  MDM: pt with left back pain. Seems MSK in nature based on history and I suspect muscle spasm, however discussed that other diagnosis are possible and if no improvement with meds she will need an in person eval.    Tests Ordered: No orders of the defined types were placed in this encounter.   Medication Changes: No orders of the defined types were placed in this encounter.    Disposition:  Follow up  Signed, Lynden GORMAN Snuffer, PA-C  08/04/2024 6:09 PM

## 2024-08-07 ENCOUNTER — Ambulatory Visit: Admitting: Adult Health

## 2024-08-10 ENCOUNTER — Other Ambulatory Visit: Payer: Self-pay | Admitting: Family Medicine

## 2024-08-13 ENCOUNTER — Ambulatory Visit: Admitting: Family Medicine

## 2025-01-20 ENCOUNTER — Other Ambulatory Visit: Payer: Self-pay | Admitting: Family Medicine

## 2025-01-20 DIAGNOSIS — D500 Iron deficiency anemia secondary to blood loss (chronic): Secondary | ICD-10-CM
